# Patient Record
Sex: Female | Born: 1965 | Race: White | Hispanic: No | Marital: Married | Smoking: Current every day smoker
Health system: Southern US, Community
[De-identification: ages and names within clinical notes are randomized; demographics above are authoritative.]

## PROBLEM LIST (undated history)

## (undated) DIAGNOSIS — R002 Palpitations: Secondary | ICD-10-CM

## (undated) DIAGNOSIS — M25569 Pain in unspecified knee: Secondary | ICD-10-CM

## (undated) DIAGNOSIS — K219 Gastro-esophageal reflux disease without esophagitis: Secondary | ICD-10-CM

## (undated) DIAGNOSIS — T8859XA Other complications of anesthesia, initial encounter: Secondary | ICD-10-CM

## (undated) DIAGNOSIS — E781 Pure hyperglyceridemia: Secondary | ICD-10-CM

## (undated) DIAGNOSIS — E785 Hyperlipidemia, unspecified: Secondary | ICD-10-CM

## (undated) DIAGNOSIS — F419 Anxiety disorder, unspecified: Secondary | ICD-10-CM

## (undated) DIAGNOSIS — G43109 Migraine with aura, not intractable, without status migrainosus: Secondary | ICD-10-CM

## (undated) DIAGNOSIS — H409 Unspecified glaucoma: Secondary | ICD-10-CM

## (undated) DIAGNOSIS — R7303 Prediabetes: Secondary | ICD-10-CM

## (undated) DIAGNOSIS — E282 Polycystic ovarian syndrome: Secondary | ICD-10-CM

## (undated) DIAGNOSIS — R600 Localized edema: Secondary | ICD-10-CM

## (undated) DIAGNOSIS — M549 Dorsalgia, unspecified: Secondary | ICD-10-CM

## (undated) DIAGNOSIS — G4730 Sleep apnea, unspecified: Secondary | ICD-10-CM

## (undated) DIAGNOSIS — L409 Psoriasis, unspecified: Secondary | ICD-10-CM

## (undated) DIAGNOSIS — F32A Depression, unspecified: Secondary | ICD-10-CM

## (undated) DIAGNOSIS — K589 Irritable bowel syndrome without diarrhea: Secondary | ICD-10-CM

## (undated) DIAGNOSIS — T4145XA Adverse effect of unspecified anesthetic, initial encounter: Secondary | ICD-10-CM

## (undated) HISTORY — DX: Gastro-esophageal reflux disease without esophagitis: K21.9

## (undated) HISTORY — DX: Sleep apnea, unspecified: G47.30

## (undated) HISTORY — DX: Pain in unspecified knee: M25.569

## (undated) HISTORY — DX: Migraine with aura, not intractable, without status migrainosus: G43.109

## (undated) HISTORY — PX: CERVICAL CERCLAGE: SHX1329

## (undated) HISTORY — DX: Hyperlipidemia, unspecified: E78.5

## (undated) HISTORY — DX: Psoriasis, unspecified: L40.9

## (undated) HISTORY — DX: Depression, unspecified: F32.A

## (undated) HISTORY — DX: Palpitations: R00.2

## (undated) HISTORY — PX: CHOLECYSTECTOMY: SHX55

## (undated) HISTORY — DX: Pure hyperglyceridemia: E78.1

## (undated) HISTORY — DX: Irritable bowel syndrome, unspecified: K58.9

## (undated) HISTORY — DX: Prediabetes: R73.03

## (undated) HISTORY — DX: Polycystic ovarian syndrome: E28.2

## (undated) HISTORY — DX: Dorsalgia, unspecified: M54.9

## (undated) HISTORY — DX: Localized edema: R60.0

---

## 2005-10-25 ENCOUNTER — Ambulatory Visit: Payer: Self-pay | Admitting: Gastroenterology

## 2005-10-28 ENCOUNTER — Ambulatory Visit: Payer: Self-pay | Admitting: Internal Medicine

## 2005-10-28 ENCOUNTER — Ambulatory Visit (HOSPITAL_COMMUNITY): Admission: RE | Admit: 2005-10-28 | Discharge: 2005-10-28 | Payer: Self-pay | Admitting: Internal Medicine

## 2005-12-13 ENCOUNTER — Ambulatory Visit: Payer: Self-pay | Admitting: Internal Medicine

## 2006-02-26 ENCOUNTER — Ambulatory Visit: Payer: Self-pay | Admitting: Orthopedic Surgery

## 2006-03-31 ENCOUNTER — Ambulatory Visit: Payer: Self-pay | Admitting: Orthopedic Surgery

## 2012-03-30 ENCOUNTER — Ambulatory Visit (INDEPENDENT_AMBULATORY_CARE_PROVIDER_SITE_OTHER): Payer: BC Managed Care – PPO | Admitting: Orthopedic Surgery

## 2012-03-30 ENCOUNTER — Ambulatory Visit (INDEPENDENT_AMBULATORY_CARE_PROVIDER_SITE_OTHER): Payer: BC Managed Care – PPO

## 2012-03-30 ENCOUNTER — Encounter: Payer: Self-pay | Admitting: Orthopedic Surgery

## 2012-03-30 VITALS — BP 100/60 | Ht 64.0 in | Wt 261.0 lb

## 2012-03-30 DIAGNOSIS — M25569 Pain in unspecified knee: Secondary | ICD-10-CM

## 2012-03-30 DIAGNOSIS — M222X9 Patellofemoral disorders, unspecified knee: Secondary | ICD-10-CM | POA: Insufficient documentation

## 2012-03-30 DIAGNOSIS — M25561 Pain in right knee: Secondary | ICD-10-CM

## 2012-03-30 NOTE — Patient Instructions (Addendum)
Knee Exercises  EXERCISES  RANGE OF MOTION(ROM) AND STRETCHING EXERCISES  These exercises may help you when beginning to rehabilitate your injury. Your symptoms may resolve with or without further involvement from your physician, physical therapist or athletic trainer. While completing these exercises, remember:   · Restoring tissue flexibility helps normal motion to return to the joints. This allows healthier, less painful movement and activity.  · An effective stretch should be held for at least 30 seconds.  · A stretch should never be painful. You should only feel a gentle lengthening or release in the stretched tissue.  STRETCH - Knee Extension, Prone  · Lie on your stomach on a firm surface, such as a bed or countertop. Place your right / left knee and leg just beyond the edge of the surface. You may wish to place a towel under the far end of your right / left thigh for comfort.  · Relax your leg muscles and allow gravity to straighten your knee. Your clinician may advise you to add an ankle weight if more resistance is helpful for you.  · You should feel a stretch in the back of your right / left knee. Hold this position for __________ seconds.  Repeat __________ times. Complete this stretch __________ times per day.  * Your physician, physical therapist or athletic trainer may ask you to add ankle weight to enhance your stretch.   RANGE OF MOTION - Knee Flexion, Active  · Lie on your back with both knees straight. (If this causes back discomfort, bend your opposite knee, placing your foot flat on the floor.)  · Slowly slide your heel back toward your buttocks until you feel a gentle stretch in the front of your knee or thigh.  · Hold for __________ seconds. Slowly slide your heel back to the starting position.  Repeat __________ times. Complete this exercise __________ times per day.   STRETCH - Quadriceps, Prone   · Lie on your stomach on a firm surface, such as a bed or padded floor.  · Bend your right /  left knee and grasp your ankle. If you are unable to reach, your ankle or pant leg, use a belt around your foot to lengthen your reach.  · Gently pull your heel toward your buttocks. Your knee should not slide out to the side. You should feel a stretch in the front of your thigh and/or knee.  · Hold this position for __________ seconds.  Repeat __________ times. Complete this stretch __________ times per day.   STRETCH - Hamstrings, Supine   · Lie on your back. Loop a belt or towel over the ball of your right / left foot.  · Straighten your right / left knee and slowly pull on the belt to raise your leg. Do not allow the right / left knee to bend. Keep your opposite leg flat on the floor.  · Raise the leg until you feel a gentle stretch behind your right / left knee or thigh. Hold this position for __________ seconds.  Repeat __________ times. Complete this stretch __________ times per day.   STRENGTHENING EXERCISES  These exercises may help you when beginning to rehabilitate your injury. They may resolve your symptoms with or without further involvement from your physician, physical therapist or athletic trainer. While completing these exercises, remember:   · Muscles can gain both the endurance and the strength needed for everyday activities through controlled exercises.  · Complete these exercises as instructed by your physician,   physical therapist or athletic trainer. Progress the resistance and repetitions only as guided.  · You may experience muscle soreness or fatigue, but the pain or discomfort you are trying to eliminate should never worsen during these exercises. If this pain does worsen, stop and make certain you are following the directions exactly. If the pain is still present after adjustments, discontinue the exercise until you can discuss the trouble with your clinician.  STRENGTH - Quadriceps, Isometrics  · Lie on your back with your right / left leg extended and your opposite knee  bent.  · Gradually tense the muscles in the front of your right / left thigh. You should see either your knee cap slide up toward your hip or increased dimpling just above the knee. This motion will push the back of the knee down toward the floor/mat/bed on which you are lying.  · Hold the muscle as tight as you can without increasing your pain for __________ seconds.  · Relax the muscles slowly and completely in between each repetition.  Repeat __________ times. Complete this exercise __________ times per day.   STRENGTH - Quadriceps, Short Arcs   · Lie on your back. Place a __________ inch towel roll under your knee so that the knee slightly bends.  · Raise only your lower leg by tightening the muscles in the front of your thigh. Do not allow your thigh to rise.  · Hold this position for __________ seconds.  Repeat __________ times. Complete this exercise __________ times per day.   OPTIONAL ANKLE WEIGHTS: Begin with ____________________, but DO NOT exceed ____________________. Increase in 1 pound/0.5 kilogram increments.   STRENGTH - Quadriceps, Straight Leg Raises   Quality counts! Watch for signs that the quadriceps muscle is working to insure you are strengthening the correct muscles and not "cheating" by substituting with healthier muscles.  · Lay on your back with your right / left leg extended and your opposite knee bent.  · Tense the muscles in the front of your right / left thigh. You should see either your knee cap slide up or increased dimpling just above the knee. Your thigh may even quiver.  · Tighten these muscles even more and raise your leg 4 to 6 inches off the floor. Hold for __________ seconds.  · Keeping these muscles tense, lower your leg.  · Relax the muscles slowly and completely in between each repetition.  Repeat __________ times. Complete this exercise __________ times per day.   STRENGTH - Hamstring, Curls  · Lay on your stomach with your legs extended. (If you lay on a bed, your feet  may hang over the edge.)  · Tighten the muscles in the back of your thigh to bend your right / left knee up to 90 degrees. Keep your hips flat on the bed/floor.  · Hold this position for __________ seconds.  · Slowly lower your leg back to the starting position.  Repeat __________ times. Complete this exercise __________ times per day.   OPTIONAL ANKLE WEIGHTS: Begin with ____________________, but DO NOT exceed ____________________. Increase in 1 pound/0.5 kilogram increments.   STRENGTH - Quadriceps, Squats  · Stand in a door frame so that your feet and knees are in line with the frame.  · Use your hands for balance, not support, on the frame.  · Slowly lower your weight, bending at the hips and knees. Keep your lower legs upright so that they are parallel with the door frame. Squat only within the range   that does not increase your knee pain. Never let your hips drop below your knees.  · Slowly return upright, pushing with your legs, not pulling with your hands.  Repeat __________ times. Complete this exercise __________ times per day.   STRENGTH - Quadriceps, Wall Slides   Follow guidelines for form closely. Increased knee pain often results from poorly placed feet or knees.  · Lean against a smooth wall or door and walk your feet out 18-24 inches. Place your feet hip-width apart.  · Slowly slide down the wall or door until your knees bend __________ degrees.* Keep your knees over your heels, not your toes, and in line with your hips, not falling to either side.  · Hold for __________ seconds. Stand up to rest for __________ seconds in between each repetition.  Repeat __________ times. Complete this exercise __________ times per day.  * Your physician, physical therapist or athletic trainer will alter this angle based on your symptoms and progress.  Document Released: 04/10/2005 Document Revised: 08/19/2011 Document Reviewed: 09/08/2008  [employer]® Patient Information ©2013 [employer].

## 2012-03-31 ENCOUNTER — Encounter: Payer: Self-pay | Admitting: Orthopedic Surgery

## 2012-03-31 NOTE — Progress Notes (Signed)
Patient ID: Cheyenne Hamilton, female   DOB: June 24, 1965, 46 y.o.   MRN: [id number] Chief Complaint  Patient presents with  . Knee Pain    right knee pain, injured 08/2011      46 year old female questionable injury to the right knee in March of 2013. She participate in some classes she has pain when she twists on the knee she may have injured it back in March but then had a second event while she was weed eating in the foot and leg were as awkward position. She presents complaining of sharp throbbing pain at night in bed. She is 8/10 pain that comes and goes and is worse when she gets up from a sitting position to a standing position. She has difficulty squatting. She has not had locking or catching as no swelling. She takes medication for reflux and cannot take NSAIDs  She does have some palpitations when she's under stress she has heartburn psoriasis and anxiety all other systems reviewed were normal  History reviewed   Exam  Nursing note and vitals reviewed. Constitutional: She is oriented to person, place, and time. She appears well-developed and well-nourished.  HENT:  Head: Normocephalic.  Neck: Neck supple.  Cardiovascular: Intact distal pulses.   Pulmonary/Chest: No respiratory distress.  Abdominal: She exhibits no distension.  Neurological: She is alert and oriented to person, place, and time. She has normal reflexes. She displays normal reflexes. She exhibits normal muscle tone. Coordination normal.  Skin: Skin is warm and dry. No rash noted. No erythema. No pallor.  Psychiatric: She has a normal mood and affect. Her behavior is normal. Judgment and thought content normal.  Ortho Exam   Right knee shows no tenderness in the patellofemoral region including the quadriceps and patellar tendon she has full range of motion in the knee the knee is stable strength is normal scans intact. Provocative tests for patellofemoral disease negative. Provocative tests for meniscal pathology  McMurray's positive for soft positive, Apley's test soft positive  X-rays unremarkable   Anterior knee pain syndrome versus medial meniscal tear  Recommend he exercise program followup in one month

## 2017-02-03 ENCOUNTER — Encounter (INDEPENDENT_AMBULATORY_CARE_PROVIDER_SITE_OTHER): Payer: Self-pay | Admitting: *Deleted

## 2017-02-17 ENCOUNTER — Telehealth: Payer: Self-pay

## 2017-02-17 NOTE — Telephone Encounter (Signed)
Gastroenterology Pre-Procedure Review  Request Date: Requesting Physician: [place]  Last TCS at age of 53  PATIENT REVIEW QUESTIONS: The patient responded to the following health history questions as indicated:    1. Diabetes Melitis: NO 2. Joint replacements in the past 12 months: NO 3. Major health problems in the past 3 months: NO 4. Has an artificial valve or MVP: NO 5. Has a defibrillator: NO 6. Has been advised in past to take antibiotics in advance of a procedure like teeth cleaning: NO 7. Family history of colon cancer: NO 8. Alcohol Use: NO 9. History of sleep apnea: NO 10. History of coronary artery or other vascular stents placed within the last 12 months: NO 11. History of any prior anesthesia complications: NO    MEDICATIONS & ALLERGIES:    Patient reports the following regarding taking any blood thinners:   Plavix? NO Aspirin? NO Coumadin? NO Brilinta? NO Xarelto? NO Eliquis? NO Pradaxa? NO Savaysa? NO Effient? NO  Patient confirms/reports the following medications:  Current Outpatient Prescriptions  Medication Sig Dispense Refill  . CLONAZEPAM PO Take 0.5 mg by mouth as needed.     . doxycycline (VIBRAMYCIN) 100 MG capsule Take 100 mg by mouth as needed.    Marland Kitchen FLUoxetine HCl (PROZAC PO) Take 40 mg by mouth daily.     . folic acid (FOLVITE) 1 MG tablet Take 1 mg by mouth daily.    . furosemide (LASIX) 20 MG tablet Take 20 mg by mouth.    . methotrexate (RHEUMATREX) 2.5 MG tablet Take 7.5 mg by mouth 3 (three) times a week. 6 pills every monday    . Multiple Vitamin (MULTIVITAMIN) capsule Take 1 capsule by mouth daily.    Marland Kitchen OMEPRAZOLE PO Take 20 mg by mouth daily.      No current facility-administered medications for this visit.     Patient confirms/reports the following allergies:  Allergies  Allergen Reactions  . Hydrocodone     No orders of the defined types were placed in this encounter.   AUTHORIZATION INFORMATION Primary Insurance: [place],   [place] #:[id number] ,  Group #:[id number]  Pre-Cert / Josem Kaufmann required:  Pre-Cert / Auth #:    SCHEDULE INFORMATION: Procedure has been scheduled as follows:  Date: , Time:   Location:   This Gastroenterology Pre-Precedure Review Form is being routed to the following provider(s): R. Garfield Cornea, MD She would like a Monday (not 02/06/16)

## 2017-02-17 NOTE — Telephone Encounter (Signed)
Pt received a letter from DS to be triaged for a colonoscopy. I told her that DS was at lunch and she does her triages between 130-430pm. 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 She said that she would try to call back around 330pm on her break and speak to DS then. Patient does not take blood thinners.

## 2017-02-18 NOTE — Telephone Encounter (Signed)
Please find out how often patient takes clonazepam. If she takes several times per week, then she will need ov to consider propofol since she is also on Prozac.

## 2017-02-19 NOTE — Telephone Encounter (Signed)
LMOM to call back

## 2017-02-24 NOTE — Telephone Encounter (Signed)
LMOM to call back

## 2017-02-24 NOTE — Telephone Encounter (Signed)
Pt has an OV on Nov 8 @ 2:30 pm

## 2017-02-27 NOTE — Telephone Encounter (Signed)
Pt's appointment has been moved up to 03/03/17 @ 12:30 pm. She is aware.

## 2017-03-03 ENCOUNTER — Ambulatory Visit (INDEPENDENT_AMBULATORY_CARE_PROVIDER_SITE_OTHER): Payer: 59 | Admitting: Nurse Practitioner

## 2017-03-03 ENCOUNTER — Other Ambulatory Visit: Payer: Self-pay

## 2017-03-03 ENCOUNTER — Encounter: Payer: Self-pay | Admitting: Nurse Practitioner

## 2017-03-03 DIAGNOSIS — R197 Diarrhea, unspecified: Secondary | ICD-10-CM | POA: Diagnosis not present

## 2017-03-03 DIAGNOSIS — Z1211 Encounter for screening for malignant neoplasm of colon: Secondary | ICD-10-CM

## 2017-03-03 DIAGNOSIS — R69 Illness, unspecified: Secondary | ICD-10-CM

## 2017-03-03 MED ORDER — NA SULFATE-K SULFATE-MG SULF 17.5-3.13-1.6 GM/177ML PO SOLN
1.0000 | ORAL | 0 refills | Status: DC
Start: 2017-03-03 — End: 2017-03-11

## 2017-03-03 MED ORDER — CHOLESTYRAMINE 4 G PO PACK: 4.0000 g | PACK | Freq: Three times a day (TID) | ORAL | 12 refills | Status: DC

## 2017-03-03 NOTE — Patient Instructions (Addendum)
1. We will request your previous colonoscopy reports from the hospital to have been scanned in an readily available in our system. 2. We will schedule your procedure for you. 3. I have sent in Questran 4g powder. Take this once a day. 4. Call us in a couple weeks and let us know if it is helping. 5. Return for follow-up in 3 months.

## 2017-03-03 NOTE — Assessment & Plan Note (Signed)
Patient with significant diarrhea which she stated was due to irritable bowel syndrome diarrhea type. However, given the fact she does not have any abdominal pain and her diarrhea all started after her cholecystectomy she is more likely having bile salt diarrhea. I will trial her on Colestid pills 2 g twice a day. Return for follow-up in 3 months. Further evaluation by colonoscopy as per above.

## 2017-03-03 NOTE — Assessment & Plan Note (Signed)
The patient is due for a colonoscopy. She is on multiple medications which can cause high risk/difficult sedation. We will plan for the procedure on propofol in order to help mitigate these risks. Proceed with colonoscopy as previously recommended.  Proceed with TCS on propofol/MAC with Dr. Gala Romney in near future: the risks, benefits, and alternatives have been discussed with the patient in detail. The patient states understanding and desires to proceed.  The patient is currently on clonazepam and Klonopin. She is also very high anxiety. No other anticoagulants, anxiolytics, chronic pain medications, or antidepressants. We will plan for propofol/MAC to promote adequate sedation.

## 2017-03-03 NOTE — Progress Notes (Signed)
Primary Care Physician:  Denny Levy, [place] Primary Gastroenterologist:  Dr. Gala Romney  Chief Complaint  Patient presents with  . Colonoscopy    HPI:   Cheyenne Hamilton is a 51 y.o. female who presents To discuss upcoming colonoscopy given polypharmacy. Phone triage was attempted but was diverted to an office visit because of Klonopin and clonazepam likely necessitating propofol and the need for office visit and preoperative appointment prior to propofol with anesthesia service. No previous visits in our office.  Today she states she had a colonoscopy with our group about 11 years ago, report not in system. She is now due to colonoscopy. Denies persistent abdominal pain, N/V, hematochezia, melena, fever, chills, unintentional weight loss, acute bowel habit changes. Does have "pretty severe IBS-D" but doesn't take anything because she's scared it'll overshoot and lead to constipation. States her IBS-D started when she had her gallbladder out. States she was Rx Bentyl but didn't take it because she read somewhere that it can cause heatstroke. Has 6-8 loose stools a day "and it really impacts my life." Denies chest pain, dyspnea, dizziness, lightheadedness, syncope, near syncope. Denies any other upper or lower GI symptoms.  Past Medical History:  Diagnosis Date  . Acid reflux     Past Surgical History:  Procedure Laterality Date  . CERVICAL CERCLAGE    . CHOLECYSTECTOMY      Current Outpatient Prescriptions  Medication Sig Dispense Refill  . CLONAZEPAM PO Take 0.5 mg by mouth as needed.     . doxycycline (VIBRAMYCIN) 100 MG capsule Take 100 mg by mouth as needed.    Marland Kitchen FLUoxetine HCl (PROZAC PO) Take 40 mg by mouth daily.     . folic acid (FOLVITE) 1 MG tablet Take 1 mg by mouth daily.    . furosemide (LASIX) 20 MG tablet Take 20 mg by mouth.    . methotrexate (RHEUMATREX) 2.5 MG tablet Take 7.5 mg by mouth 3 (three) times a week. 6 pills every monday    . Multiple Vitamin  (MULTIVITAMIN) capsule Take 1 capsule by mouth daily.    Marland Kitchen OMEPRAZOLE PO Take 20 mg by mouth daily.     . cholestyramine (QUESTRAN) 4 g packet Take 1 packet (4 g total) by mouth 3 (three) times daily with meals. 60 each 12   No current facility-administered medications for this visit.     Allergies as of 03/03/2017 - Review Complete 03/03/2017  Allergen Reaction Noted  . Hydrocodone  03/30/2012    Family History  Problem Relation Age of Onset  . Arthritis Unknown     Social History   Social History  . Marital status: Single    Spouse name: N/A  . Number of children: N/A  . Years of education: 92   Occupational History  . Not on file.   Social History Main Topics  . Smoking status: Current Every Day Smoker    Packs/day: 0.50  . Smokeless tobacco: Never Used  . Alcohol use No  . Drug use: No  . Sexual activity: Not on file   Other Topics Concern  . Not on file   Social History Narrative  . No narrative on file    Review of Systems: General: Negative for anorexia, weight loss, fever, chills, fatigue, weakness. ENT: Negative for hoarseness, difficulty swallowing , nasal congestion. CV: Negative for chest pain, angina, palpitations, dyspnea on exertion, peripheral edema.  Respiratory: Negative for dyspnea at rest, dyspnea on exertion, cough, sputum, wheezing.  GI: See history  of present illness. MS: Negative for joint pain, low back pain.  Derm: Negative for rash or itching.  Psych: Admits depression.  Endo: Negative for unusual weight change.  Heme: Negative for bruising or bleeding. Allergy: Negative for rash or hives.    Physical Exam: BP 116/76   Pulse 71   Temp (!) 97.2 F (36.2 C) (Oral)   Ht 5\' 4"  (1.626 m)   Wt 245 lb 12.8 oz (111.5 kg)   BMI 42.19 kg/m  General:   Obese female. Alert and oriented. Pleasant and cooperative. Well-nourished and well-developed.  Head:  Normocephalic and atraumatic. Eyes:  Without icterus, sclera clear and  conjunctiva pink.  Ears:  Normal auditory acuity. Cardiovascular:  S1, S2 present without murmurs appreciated. Extremities without clubbing or edema. Respiratory:  Clear to auscultation bilaterally. No wheezes, rales, or rhonchi. No distress.  Gastrointestinal:  +BS, obese but soft, non-tender and non-distended. No HSM noted. No guarding or rebound. No masses appreciated.  Rectal:  Deferred  Musculoskalatal:  Symmetrical without gross deformities. Normal posture. Neurologic:  Alert and oriented x4;  grossly normal neurologically. Psych:  Alert and cooperative. Normal mood and affect. Heme/Lymph/Immune: No excessive bruising noted.    03/03/2017 1:36 PM   Disclaimer: This note was dictated with voice recognition software. Similar sounding words can inadvertently be transcribed and may not be corrected upon review.

## 2017-03-03 NOTE — Progress Notes (Signed)
CC'D TO PCP °

## 2017-03-04 NOTE — Patient Instructions (Signed)
PA info for Colonoscopy submitted via [facility] website. Notification/prior authorization reference number: [id number].

## 2017-03-04 NOTE — Patient Instructions (Signed)
[employer] called and spoke to GF. New notification/prior authorization reference number is [id number].

## 2017-03-12 ENCOUNTER — Encounter (HOSPITAL_COMMUNITY): Payer: Self-pay

## 2017-03-12 NOTE — Patient Instructions (Signed)
Cheyenne Hamilton  03/12/2017     @PREFPERIOPPHARMACY @   Your procedure is scheduled on  03/21/2017 .  Report to [facility] at  615  A.M.  Call this number if you have problems the morning of surgery:  [phone]   Remember:  Do not eat food or drink liquids after midnight.  Take these medicines the morning of surgery with A SIP OF WATER  Prozac, omeprazole.   Do not wear jewelry, make-up or nail polish.  Do not wear lotions, powders, or perfumes, or deoderant.  Do not shave 48 hours prior to surgery.  Men may shave face and neck.  Do not bring valuables to the hospital.  [facility] is not responsible for any belongings or valuables.  Contacts, dentures or bridgework may not be worn into surgery.  Leave your suitcase in the car.  After surgery it may be brought to your room.  For patients admitted to the hospital, discharge time will be determined by your treatment team.  Patients discharged the day of surgery will not be allowed to drive home.   Name and phone number of your driver:   family Special instructions:  Follow the diet and prep instructions given to you by Dr Roseanne Kaufman office.  Please read over the following fact sheets that you were given. Anesthesia Post-op Instructions and Care and Recovery After Surgery       Colonoscopy, Adult A colonoscopy is an exam to look at the entire large intestine. During the exam, a lubricated, bendable tube is inserted into the anus and then passed into the rectum, colon, and other parts of the large intestine. A colonoscopy is often done as a part of normal colorectal screening or in response to certain symptoms, such as anemia, persistent diarrhea, abdominal pain, and blood in the stool. The exam can help screen for and diagnose medical problems, including:  Tumors.  Polyps.  Inflammation.  Areas of bleeding.  Tell a health care provider about:  Any allergies you have.  All medicines you are taking,  including vitamins, herbs, eye drops, creams, and over-the-counter medicines.  Any problems you or family members have had with anesthetic medicines.  Any blood disorders you have.  Any surgeries you have had.  Any medical conditions you have.  Any problems you have had passing stool. What are the risks? Generally, this is a safe procedure. However, problems may occur, including:  Bleeding.  A tear in the intestine.  A reaction to medicines given during the exam.  Infection (rare).  What happens before the procedure? Eating and drinking restrictions Follow instructions from your health care provider about eating and drinking, which may include:  A few days before the procedure - follow a low-fiber diet. Avoid nuts, seeds, dried fruit, raw fruits, and vegetables.  1-3 days before the procedure - follow a clear liquid diet. Drink only clear liquids, such as clear broth or bouillon, black coffee or tea, clear juice, clear soft drinks or sports drinks, gelatin dessert, and popsicles. Avoid any liquids that contain red or purple dye.  On the day of the procedure - do not eat or drink anything during the 2 hours before the procedure, or within the time period that your health care provider recommends.  Bowel prep If you were prescribed an oral bowel prep to clean out your colon:  Take it as told by your health care provider. Starting the day before your procedure, you  will need to drink a large amount of medicated liquid. The liquid will cause you to have multiple loose stools until your stool is almost clear or light green.  If your skin or anus gets irritated from diarrhea, you may use these to relieve the irritation: ? Medicated wipes, such as adult wet wipes with aloe and vitamin E. ? A skin soothing-product like petroleum jelly.  If you vomit while drinking the bowel prep, take a break for up to 60 minutes and then begin the bowel prep again. If vomiting continues and you  cannot take the bowel prep without vomiting, call your health care provider.  General instructions  Ask your health care provider about changing or stopping your regular medicines. This is especially important if you are taking diabetes medicines or blood thinners.  Plan to have someone take you home from the hospital or clinic. What happens during the procedure?  An IV tube may be inserted into one of your veins.  You will be given medicine to help you relax (sedative).  To reduce your risk of infection: ? Your health care team will wash or sanitize their hands. ? Your anal area will be washed with soap.  You will be asked to lie on your side with your knees bent.  Your health care provider will lubricate a long, thin, flexible tube. The tube will have a camera and a light on the end.  The tube will be inserted into your anus.  The tube will be gently eased through your rectum and colon.  Air will be delivered into your colon to keep it open. You may feel some pressure or cramping.  The camera will be used to take images during the procedure.  A small tissue sample may be removed from your body to be examined under a microscope (biopsy). If any potential problems are found, the tissue will be sent to a lab for testing.  If small polyps are found, your health care provider may remove them and have them checked for cancer cells.  The tube that was inserted into your anus will be slowly removed. The procedure may vary among health care providers and hospitals. What happens after the procedure?  Your blood pressure, heart rate, breathing rate, and blood oxygen level will be monitored until the medicines you were given have worn off.  Do not drive for 24 hours after the exam.  You may have a small amount of blood in your stool.  You may pass gas and have mild abdominal cramping or bloating due to the air that was used to inflate your colon during the exam.  It is up to you to  get the results of your procedure. Ask your health care provider, or the department performing the procedure, when your results will be ready. This information is not intended to replace advice given to you by your health care provider. Make sure you discuss any questions you have with your health care provider. Document Released: 05/24/2000 Document Revised: 03/27/2016 Document Reviewed: 08/08/2015 Elsevier Interactive Patient Education  2018 [employer].  Colonoscopy, Adult, Care After This sheet gives you information about how to care for yourself after your procedure. Your health care provider may also give you more specific instructions. If you have problems or questions, contact your health care provider. What can I expect after the procedure? After the procedure, it is common to have:  A small amount of blood in your stool for 24 hours after the procedure.  Some  gas.  Mild abdominal cramping or bloating.  Follow these instructions at home: General instructions   For the first 24 hours after the procedure: ? Do not drive or use machinery. ? Do not sign important documents. ? Do not drink alcohol. ? Do your regular daily activities at a slower pace than normal. ? Eat soft, easy-to-digest foods. ? Rest often.  Take over-the-counter or prescription medicines only as told by your health care provider.  It is up to you to get the results of your procedure. Ask your health care provider, or the department performing the procedure, when your results will be ready. Relieving cramping and bloating  Try walking around when you have cramps or feel bloated.  Apply heat to your abdomen as told by your health care provider. Use a heat source that your health care provider recommends, such as a moist heat pack or a heating pad. ? Place a towel between your skin and the heat source. ? Leave the heat on for 20-30 minutes. ? Remove the heat if your skin turns bright red. This is  especially important if you are unable to feel pain, heat, or cold. You may have a greater risk of getting burned. Eating and drinking  Drink enough fluid to keep your urine clear or pale yellow.  Resume your normal diet as instructed by your health care provider. Avoid heavy or fried foods that are hard to digest.  Avoid drinking alcohol for as long as instructed by your health care provider. Contact a health care provider if:  You have blood in your stool 2-3 days after the procedure. Get help right away if:  You have more than a small spotting of blood in your stool.  You pass large blood clots in your stool.  Your abdomen is swollen.  You have nausea or vomiting.  You have a fever.  You have increasing abdominal pain that is not relieved with medicine. This information is not intended to replace advice given to you by your health care provider. Make sure you discuss any questions you have with your health care provider. Document Released: 01/09/2004 Document Revised: 02/19/2016 Document Reviewed: 08/08/2015 Elsevier Interactive Patient Education  2018 [place] Anesthesia is a term that refers to techniques, procedures, and medicines that help a person stay safe and comfortable during a medical procedure. Monitored anesthesia care, or sedation, is one type of anesthesia. Your anesthesia specialist may recommend sedation if you will be having a procedure that does not require you to be unconscious, such as:  Cataract surgery.  A dental procedure.  A biopsy.  A colonoscopy.  During the procedure, you may receive a medicine to help you relax (sedative). There are three levels of sedation:  Mild sedation. At this level, you may feel awake and relaxed. You will be able to follow directions.  Moderate sedation. At this level, you will be sleepy. You may not remember the procedure.  Deep sedation. At this level, you will be asleep. You will  not remember the procedure.  The more medicine you are given, the deeper your level of sedation will be. Depending on how you respond to the procedure, the anesthesia specialist may change your level of sedation or the type of anesthesia to fit your needs. An anesthesia specialist will monitor you closely during the procedure. Let your health care provider know about:  Any allergies you have.  All medicines you are taking, including vitamins, herbs, eye drops, creams, and over-the-counter  medicines.  Any use of steroids (by mouth or as a cream).  Any problems you or family members have had with sedatives and anesthetic medicines.  Any blood disorders you have.  Any surgeries you have had.  Any medical conditions you have, such as sleep apnea.  Whether you are pregnant or may be pregnant.  Any use of cigarettes, alcohol, or street drugs. What are the risks? Generally, this is a safe procedure. However, problems may occur, including:  Getting too much medicine (oversedation).  Nausea.  Allergic reaction to medicines.  Trouble breathing. If this happens, a breathing tube may be used to help with breathing. It will be removed when you are awake and breathing on your own.  Heart trouble.  Lung trouble.  Before the procedure Staying hydrated Follow instructions from your health care provider about hydration, which may include:  Up to 2 hours before the procedure - you may continue to drink clear liquids, such as water, clear fruit juice, black coffee, and plain tea.  Eating and drinking restrictions Follow instructions from your health care provider about eating and drinking, which may include:  8 hours before the procedure - stop eating heavy meals or foods such as meat, fried foods, or fatty foods.  6 hours before the procedure - stop eating light meals or foods, such as toast or cereal.  6 hours before the procedure - stop drinking milk or drinks that contain milk.  2  hours before the procedure - stop drinking clear liquids.  Medicines Ask your health care provider about:  Changing or stopping your regular medicines. This is especially important if you are taking diabetes medicines or blood thinners.  Taking medicines such as aspirin and ibuprofen. These medicines can thin your blood. Do not take these medicines before your procedure if your health care provider instructs you not to.  Tests and exams  You will have a physical exam.  You may have blood tests done to show: ? How well your kidneys and liver are working. ? How well your blood can clot.  General instructions  Plan to have someone take you home from the hospital or clinic.  If you will be going home right after the procedure, plan to have someone with you for 24 hours.  What happens during the procedure?  Your blood pressure, heart rate, breathing, level of pain and overall condition will be monitored.  An IV tube will be inserted into one of your veins.  Your anesthesia specialist will give you medicines as needed to keep you comfortable during the procedure. This may mean changing the level of sedation.  The procedure will be performed. After the procedure  Your blood pressure, heart rate, breathing rate, and blood oxygen level will be monitored until the medicines you were given have worn off.  Do not drive for 24 hours if you received a sedative.  You may: ? Feel sleepy, clumsy, or nauseous. ? Feel forgetful about what happened after the procedure. ? Have a sore throat if you had a breathing tube during the procedure. ? Vomit. This information is not intended to replace advice given to you by your health care provider. Make sure you discuss any questions you have with your health care provider. Document Released: 02/20/2005 Document Revised: 11/03/2015 Document Reviewed: 09/17/2015 Elsevier Interactive Patient Education  2018 [place],  Care After These instructions provide you with information about caring for yourself after your procedure. Your health care provider may also give  you more specific instructions. Your treatment has been planned according to current medical practices, but problems sometimes occur. Call your health care provider if you have any problems or questions after your procedure. What can I expect after the procedure? After your procedure, it is common to:  Feel sleepy for several hours.  Feel clumsy and have poor balance for several hours.  Feel forgetful about what happened after the procedure.  Have poor judgment for several hours.  Feel nauseous or vomit.  Have a sore throat if you had a breathing tube during the procedure.  Follow these instructions at home: For at least 24 hours after the procedure:   Do not: ? Participate in activities in which you could fall or become injured. ? Drive. ? Use heavy machinery. ? Drink alcohol. ? Take sleeping pills or medicines that cause drowsiness. ? Make important decisions or sign legal documents. ? Take care of children on your own.  Rest. Eating and drinking  Follow the diet that is recommended by your health care provider.  If you vomit, drink water, juice, or soup when you can drink without vomiting.  Make sure you have little or no nausea before eating solid foods. General instructions  Have a responsible adult stay with you until you are awake and alert.  Take over-the-counter and prescription medicines only as told by your health care provider.  If you smoke, do not smoke without supervision.  Keep all follow-up visits as told by your health care provider. This is important. Contact a health care provider if:  You keep feeling nauseous or you keep vomiting.  You feel light-headed.  You develop a rash.  You have a fever. Get help right away if:  You have trouble breathing. This information is not intended to replace  advice given to you by your health care provider. Make sure you discuss any questions you have with your health care provider. Document Released: 09/17/2015 Document Revised: 01/17/2016 Document Reviewed: 09/17/2015 Elsevier Interactive Patient Education  [employer].

## 2017-03-14 ENCOUNTER — Encounter (HOSPITAL_COMMUNITY)
Admission: RE | Admit: 2017-03-14 | Discharge: 2017-03-14 | Disposition: A | Discharge status: Home / Self Care | Payer: 59 | Source: Ambulatory Visit | Attending: Internal Medicine | Admitting: Internal Medicine

## 2017-03-14 ENCOUNTER — Encounter (HOSPITAL_COMMUNITY): Payer: Self-pay

## 2017-03-14 DIAGNOSIS — Z1211 Encounter for screening for malignant neoplasm of colon: Secondary | ICD-10-CM | POA: Insufficient documentation

## 2017-03-14 HISTORY — DX: Anxiety disorder, unspecified: F41.9

## 2017-03-14 LAB — BASIC METABOLIC PANEL
Anion gap: 8 (ref 5–15)
BUN: 15 mg/dL (ref 6–20)
CO2: 29 mmol/L (ref 22–32)
Calcium: 9.3 mg/dL (ref 8.9–10.3)
Chloride: 102 mmol/L (ref 101–111)
Creatinine, Ser: 0.85 mg/dL (ref 0.44–1.00)
GFR calc Af Amer: >60 mL/min (ref >60)
GFR calc non Af Amer: >60 mL/min (ref >60)
Glucose, Bld: 93 mg/dL (ref 65–99)
Potassium: 3.9 mmol/L (ref 3.5–5.1)
Sodium: 139 mmol/L (ref 135–145)

## 2017-03-14 LAB — CBC WITH DIFFERENTIAL/PLATELET
Basophils Absolute: 0 10*3/uL (ref 0.0–0.1)
Basophils Relative: 0 %
Eosinophils Absolute: 0.3 10*3/uL (ref 0.0–0.7)
Eosinophils Relative: 3 %
HCT: 42.7 % (ref 36.0–46.0)
Hemoglobin: 14.2 g/dL (ref 12.0–15.0)
Lymphocytes Relative: 28 %
Lymphs Abs: 2.8 10*3/uL (ref 0.7–4.0)
MCH: 31.5 pg (ref 26.0–34.0)
MCHC: 33.3 g/dL (ref 30.0–36.0)
MCV: 94.7 fL (ref 78.0–100.0)
Monocytes Absolute: 0.7 10*3/uL (ref 0.1–1.0)
Monocytes Relative: 7 %
Neutro Abs: 6.1 10*3/uL (ref 1.7–7.7)
Neutrophils Relative %: 62 %
Platelets: 321 10*3/uL (ref 150–400)
RBC: 4.51 MIL/uL (ref 3.87–5.11)
RDW: 13.8 % (ref 11.5–15.5)
WBC: 9.9 10*3/uL (ref 4.0–10.5)

## 2017-03-17 ENCOUNTER — Other Ambulatory Visit (HOSPITAL_COMMUNITY): Payer: 59

## 2017-03-20 ENCOUNTER — Telehealth: Payer: Self-pay | Admitting: Internal Medicine

## 2017-03-20 NOTE — Telephone Encounter (Signed)
Due to [facility] Procedure for 03-21-17 has been cancelled. I called and spoke to [name]. We will reach back out to her to reschedule.

## 2017-03-24 ENCOUNTER — Other Ambulatory Visit: Payer: Self-pay

## 2017-03-24 MED ORDER — NA SULFATE-K SULFATE-MG SULF 17.5-3.13-1.6 GM/177ML PO SOLN: 1.0000 | ORAL | 0 refills | Status: DC

## 2017-03-24 NOTE — Telephone Encounter (Signed)
Called pt. TCS w/propofol with RMR rescheduled to 04/18/17 at 8:30am. Rx for prep sent to pharmacy (she had already mixed one bottle). New instructions mailed. LMOVM and informed Endo scheduler.

## 2017-03-24 NOTE — Telephone Encounter (Signed)
Routing to clinical pool to reschedule procedure

## 2017-04-11 ENCOUNTER — Encounter (HOSPITAL_COMMUNITY): Payer: Self-pay

## 2017-04-14 ENCOUNTER — Encounter (HOSPITAL_COMMUNITY): Payer: Self-pay

## 2017-04-14 ENCOUNTER — Encounter (HOSPITAL_COMMUNITY)
Admission: RE | Admit: 2017-04-14 | Discharge: 2017-04-14 | Disposition: A | Discharge status: Home / Self Care | Payer: 59 | Source: Ambulatory Visit | Attending: Internal Medicine | Admitting: Internal Medicine

## 2017-04-17 ENCOUNTER — Ambulatory Visit: Payer: 59 | Admitting: Gastroenterology

## 2017-04-18 ENCOUNTER — Encounter (HOSPITAL_COMMUNITY): Payer: Self-pay | Admitting: *Deleted

## 2017-04-18 ENCOUNTER — Ambulatory Visit (HOSPITAL_COMMUNITY)
Admission: RE | Admit: 2017-04-18 | Discharge: 2017-04-18 | Disposition: A | Discharge status: Home / Self Care | Payer: 59 | Source: Ambulatory Visit | Attending: Internal Medicine | Admitting: Internal Medicine

## 2017-04-18 ENCOUNTER — Ambulatory Visit (HOSPITAL_COMMUNITY): Payer: 59 | Admitting: Anesthesiology

## 2017-04-18 ENCOUNTER — Ambulatory Visit: Payer: 59 | Admitting: Gastroenterology

## 2017-04-18 ENCOUNTER — Encounter (HOSPITAL_COMMUNITY)
Admission: RE | Disposition: A | Discharge status: Home / Self Care | Payer: Self-pay | Source: Ambulatory Visit | Attending: Internal Medicine

## 2017-04-18 DIAGNOSIS — K5730 Diverticulosis of large intestine without perforation or abscess without bleeding: Secondary | ICD-10-CM

## 2017-04-18 DIAGNOSIS — Z1211 Encounter for screening for malignant neoplasm of colon: Secondary | ICD-10-CM

## 2017-04-18 DIAGNOSIS — F419 Anxiety disorder, unspecified: Secondary | ICD-10-CM | POA: Diagnosis not present

## 2017-04-18 DIAGNOSIS — K529 Noninfective gastroenteritis and colitis, unspecified: Secondary | ICD-10-CM | POA: Insufficient documentation

## 2017-04-18 DIAGNOSIS — K219 Gastro-esophageal reflux disease without esophagitis: Secondary | ICD-10-CM | POA: Insufficient documentation

## 2017-04-18 DIAGNOSIS — Z7951 Long term (current) use of inhaled steroids: Secondary | ICD-10-CM | POA: Diagnosis not present

## 2017-04-18 DIAGNOSIS — F17210 Nicotine dependence, cigarettes, uncomplicated: Secondary | ICD-10-CM | POA: Diagnosis not present

## 2017-04-18 DIAGNOSIS — Z79899 Other long term (current) drug therapy: Secondary | ICD-10-CM | POA: Insufficient documentation

## 2017-04-18 DIAGNOSIS — D123 Benign neoplasm of transverse colon: Secondary | ICD-10-CM | POA: Diagnosis not present

## 2017-04-18 HISTORY — PX: BIOPSY: SHX5522

## 2017-04-18 HISTORY — PX: COLONOSCOPY WITH PROPOFOL: SHX5780

## 2017-04-18 HISTORY — DX: Adverse effect of unspecified anesthetic, initial encounter: T41.45XA

## 2017-04-18 HISTORY — PX: POLYPECTOMY: SHX5525

## 2017-04-18 HISTORY — DX: Other complications of anesthesia, initial encounter: T88.59XA

## 2017-04-18 SURGERY — COLONOSCOPY WITH PROPOFOL
Anesthesia: Monitor Anesthesia Care

## 2017-04-18 MED ORDER — PROPOFOL 500 MG/50ML IV EMUL
INTRAVENOUS | Status: DC | PRN
  Administered 2017-04-18: 100 ug/kg/min via INTRAVENOUS
  Administered 2017-04-18: 09:00:00 via INTRAVENOUS

## 2017-04-18 MED ORDER — FENTANYL CITRATE (PF) 100 MCG/2ML IJ SOLN
INTRAMUSCULAR | Status: AC
  Filled 2017-04-18: qty 2

## 2017-04-18 MED ORDER — MIDAZOLAM HCL 2 MG/2ML IJ SOLN
1.0000 mg | INTRAMUSCULAR | Status: AC
  Administered 2017-04-18: 2 mg via INTRAVENOUS
  Filled 2017-04-18: qty 2

## 2017-04-18 MED ORDER — MIDAZOLAM HCL 2 MG/2ML IJ SOLN
INTRAMUSCULAR | Status: AC
  Filled 2017-04-18: qty 2

## 2017-04-18 MED ORDER — MIDAZOLAM HCL 5 MG/5ML IJ SOLN
INTRAMUSCULAR | Status: DC | PRN
  Administered 2017-04-18: 2 mg via INTRAVENOUS

## 2017-04-18 MED ORDER — LACTATED RINGERS IV SOLN
INTRAVENOUS | Status: DC
  Administered 2017-04-18: 08:00:00 via INTRAVENOUS

## 2017-04-18 MED ORDER — FENTANYL CITRATE (PF) 100 MCG/2ML IJ SOLN
25.0000 ug | Freq: Once | INTRAMUSCULAR | Status: AC
  Administered 2017-04-18: 25 ug via INTRAVENOUS

## 2017-04-18 MED ORDER — PROPOFOL 10 MG/ML IV BOLUS
INTRAVENOUS | Status: AC
  Filled 2017-04-18: qty 20

## 2017-04-18 NOTE — Anesthesia Preprocedure Evaluation (Signed)
Anesthesia Evaluation  Patient identified by MRN, date of birth, ID band Patient awake    Reviewed: Allergy & Precautions, NPO status , Patient's Chart, lab work & pertinent test results  History of Anesthesia Complications (+) history of anesthetic complications ( "slow wake up")  Airway Mallampati: III  TM Distance: >3 FB Neck ROM: Full    Dental  (+) Teeth Intact   Pulmonary Current Smoker,    breath sounds clear to auscultation       Cardiovascular negative cardio ROS   Rhythm:Regular Rate:Normal     Neuro/Psych PSYCHIATRIC DISORDERS Anxiety    GI/Hepatic Neg liver ROS, GERD  Medicated and Controlled,  Endo/Other  Morbid obesity  Renal/GU negative Renal ROS     Musculoskeletal   Abdominal   Peds  Hematology negative hematology ROS (+)   Anesthesia Other Findings   Reproductive/Obstetrics                             Anesthesia Physical Anesthesia Plan  ASA: II  Anesthesia Plan: MAC   Post-op Pain Management:    Induction: Intravenous  PONV Risk Score and Plan:   Airway Management Planned: Simple Face Mask  Additional Equipment:   Intra-op Plan:   Post-operative Plan:   Informed Consent: I have reviewed the patients History and Physical, chart, labs and discussed the procedure including the risks, benefits and alternatives for the proposed anesthesia with the patient or authorized representative who has indicated his/her understanding and acceptance.     Plan Discussed with:   Anesthesia Plan Comments:         Anesthesia Quick Evaluation

## 2017-04-18 NOTE — Discharge Instructions (Signed)
°  Colonoscopy Discharge Instructions  Read the instructions outlined below and refer to this sheet in the next few weeks. These discharge instructions provide you with general information on caring for yourself after you leave the hospital. Your doctor may also give you specific instructions. While your treatment has been planned according to the most current medical practices available, unavoidable complications occasionally occur. If you have any problems or questions after discharge, call Dr. Gala Romney at [phone]. ACTIVITY  You may resume your regular activity, but move at a slower pace for the next 24 hours.   Take frequent rest periods for the next 24 hours.   Walking will help get rid of the air and reduce the bloated feeling in your belly (abdomen).   No driving for 24 hours (because of the medicine (anesthesia) used during the test).    Do not sign any important legal documents or operate any machinery for 24 hours (because of the anesthesia used during the test).  NUTRITION  Drink plenty of fluids.   You may resume your normal diet as instructed by your doctor.   Begin with a light meal and progress to your normal diet. Heavy or fried foods are harder to digest and may make you feel sick to your stomach (nauseated).   Avoid alcoholic beverages for 24 hours or as instructed.  MEDICATIONS  You may resume your normal medications unless your doctor tells you otherwise.  WHAT YOU CAN EXPECT TODAY  Some feelings of bloating in the abdomen.   Passage of more gas than usual.   Spotting of blood in your stool or on the toilet paper.  IF YOU HAD POLYPS REMOVED DURING THE COLONOSCOPY:  No aspirin products for 7 days or as instructed.   No alcohol for 7 days or as instructed.   Eat a soft diet for the next 24 hours.  FINDING OUT THE RESULTS OF YOUR TEST Not all test results are available during your visit. If your test results are not back during the visit, make an appointment  with your caregiver to find out the results. Do not assume everything is normal if you have not heard from your caregiver or the medical facility. It is important for you to follow up on all of your test results.  SEEK IMMEDIATE MEDICAL ATTENTION IF:  You have more than a spotting of blood in your stool.   Your belly is swollen (abdominal distention).   You are nauseated or vomiting.   You have a temperature over 101.   You have abdominal pain or discomfort that is severe or gets worse throughout the day.    Colon Diverticulosis and polyp information provided  Further recommendations to follow pending review of pathology report  Do not start cholestyramine quite yet

## 2017-04-18 NOTE — Anesthesia Postprocedure Evaluation (Signed)
Anesthesia Post Note  Patient: Cheyenne Hamilton  Procedure(s) Performed: COLONOSCOPY WITH PROPOFOL (N/A ) POLYPECTOMY BIOPSY  Patient location during evaluation: PACU Anesthesia Type: MAC Level of consciousness: awake and alert and oriented Pain management: pain level controlled Vital Signs Assessment: post-procedure vital signs reviewed and stable Respiratory status: spontaneous breathing, nonlabored ventilation and respiratory function stable Cardiovascular status: blood pressure returned to baseline Postop Assessment: no apparent nausea or vomiting Anesthetic complications: no     Last Vitals:  Vitals:   04/18/17 0713 04/18/17 0923  BP: 117/69 112/66  Pulse: 85 88  Resp: 20 15  Temp: 36.8 C 36.4 C  SpO2: 96% 94%    Last Pain:  Vitals:   04/18/17 0923  TempSrc:   PainSc: 0-No pain                 Dennys Traughber J

## 2017-04-18 NOTE — Transfer of Care (Signed)
Immediate Anesthesia Transfer of Care Note  Patient: Cheyenne Hamilton  Procedure(s) Performed: COLONOSCOPY WITH PROPOFOL (N/A ) POLYPECTOMY BIOPSY  Patient Location: PACU  Anesthesia Type:MAC  Level of Consciousness: awake, oriented and patient cooperative  Airway & Oxygen Therapy: Patient Spontanous Breathing  Post-op Assessment: Report given to RN, Post -op Vital signs reviewed and stable and Patient moving all extremities  Post vital signs: Reviewed and stable  Last Vitals:  Vitals:   04/18/17 0713  BP: 117/69  Pulse: 85  Resp: 20  Temp: 36.8 C  SpO2: 96%    Last Pain:  Vitals:   04/18/17 0853  TempSrc:   PainSc: 0-No pain      Patients Stated Pain Goal: 5 (19/41/74 0814)  Complications: No apparent anesthesia complications

## 2017-04-18 NOTE — Op Note (Signed)
[facility] Patient Name: Cheyenne Hamilton Procedure Date: 04/18/2017 7:51 AM MRN: [id number] Date of Birth: 07/24/1965 Attending MD: Norvel Richards , MD CSN: [id number] Age: 51 Admit Type: Outpatient Procedure:                Colonoscopy Indications:              Screening for colorectal malignant neoplasm;                            chronic diarrhea Providers:                Norvel Richards, MD, Lurline Del, RN, Aram Candela Referring MD:              Medicines:                Propofol per Anesthesia Complications:            No immediate complications. Estimated blood loss:                            Minimal. Estimated Blood Loss:     Estimated blood loss was minimal. Procedure:                Pre-Anesthesia Assessment:                           - Prior to the procedure, a History and Physical                            was performed, and patient medications and                            allergies were reviewed. The patient's tolerance of                            previous anesthesia was also reviewed. The risks                            and benefits of the procedure and the sedation                            options and risks were discussed with the patient.                            All questions were answered, and informed consent                            was obtained. Prior Anticoagulants: The patient has                            taken no previous anticoagulant or antiplatelet                            agents. ASA Grade Assessment: II - A  patient with                            mild systemic disease. After reviewing the risks                            and benefits, the patient was deemed in                            satisfactory condition to undergo the procedure.                           After obtaining informed consent, the colonoscope                            was passed under direct vision. Throughout the               procedure, the patient's blood pressure, pulse, and                            oxygen saturations were monitored continuously. The                            EC-[id number] ([id number]) scope was introduced through                            the anus and advanced to the the cecum, identified                            by appendiceal orifice and ileocecal valve. The                            colonoscopy was performed without difficulty. The                            patient tolerated the procedure well. The entire                            colon was well visualized. The terminal ileum,                            ileocecal valve, appendiceal orifice, and rectum                            and the ileocecal valve, appendiceal orifice, and                            rectum were photographed. The entire colon was well                            visualized. The quality of the bowel preparation                            was adequate. Scope In: 8:54:55 AM Scope Out: 9:10:00 AM  Scope Withdrawal Time: 0 hours 7 minutes 19 seconds  Total Procedure Duration: 0 hours 15 minutes 5 seconds  Findings:      The perianal and digital rectal examinations were normal.      Scattered small and large-mouthed diverticula were found in the entire       colon.      Two sessile polyps were found in the transverse colon. The polyps were 5       to 6 mm in size. These polyps were removed with a cold snare. Resection       and retrieval were complete. Estimated blood loss was minimal.      The exam was otherwise without abnormality on direct and retroflexion       views. Segmented biopsies of the ascending, descending and sigmoid       segments taken to evaluate for microscopic colitis. Impression:               - Five 5 to 6 mm polyps in the transverse colon and                            in the mid transverse colon, removed with a cold                            snare. Resected and retrieved.                            - The examination was otherwise normal on direct                            and retroflexion views. Segmental biopsies taken. Moderate Sedation:      Moderate (conscious) sedation was personally administered by an       anesthesia professional. The following parameters were monitored: oxygen       saturation, heart rate, blood pressure, respiratory rate, EKG, adequacy       of pulmonary ventilation, and response to care. Total physician       intraservice time was 20 minutes. Recommendation:           - Patient has a contact number available for                            emergencies. The signs and symptoms of potential                            delayed complications were discussed with the                            patient. Return to normal activities tomorrow.                            Written discharge instructions were provided to the                            patient.                           - Resume previous diet.                           -  Continue present medications.                           - Repeat colonoscopy date to be determined after                            pending pathology results are reviewed for                            surveillance.                           - Return to GI clinic (date not yet determined). Procedure Code(s):        --- Professional ---                           [phone], Colonoscopy, flexible; with removal of                            tumor(s), polyp(s), or other lesion(s) by snare                            technique Diagnosis Code(s):        --- Professional ---                           Z12.11, Encounter for screening for malignant                            neoplasm of colon                           D12.3, Benign neoplasm of transverse colon (hepatic                            flexure or splenic flexure) CPT copyright 2016 American [employer] Association. All rights reserved. The codes documented in this report are preliminary  and upon coder review may  be revised to meet current compliance requirements. Cristopher Estimable. Tonya Wantz, MD Norvel Richards, MD 04/18/2017 9:41:06 AM This report has been signed electronically. Number of Addenda: 0

## 2017-04-18 NOTE — H&P (Signed)
_0 @   Primary Care Physician:  Denny Levy, [place] Primary Gastroenterologist:  Dr. Gala Romney  Pre-Procedure History & Physical: HPI:  Cheyenne Hamilton is a 51 y.o. female here for screening colonoscopy.  Patient reports diarrhea all the time since she got her gallbladder out  Past Medical History:  Diagnosis Date  . Acid reflux   . Anxiety   . Complication of anesthesia    Hard to wake up    Past Surgical History:  Procedure Laterality Date  . CERVICAL CERCLAGE    . CHOLECYSTECTOMY      Prior to Admission medications   Medication Sig Start Date End Date Taking? Authorizing Provider  clobetasol cream (TEMOVATE) 1.22 % Apply 1 application topically 2 (two) times daily as needed.   Yes [provider]  clonazePAM (KLONOPIN) 0.5 MG tablet Take 0.5 mg by mouth at bedtime.    Yes [provider]  doxycycline (VIBRAMYCIN) 100 MG capsule Take 100 mg by mouth daily as needed (skin irritation).    Yes [provider]  FLUoxetine (PROZAC) 40 MG capsule Take 40 mg by mouth daily.    Yes [provider]  fluticasone (FLONASE) 50 MCG/ACT nasal spray Place 1 spray into both nostrils daily as needed for allergies or rhinitis.   Yes [provider]  folic acid (FOLVITE) 1 MG tablet Take 1 mg by mouth daily.   Yes [provider]  furosemide (LASIX) 20 MG tablet Take 20 mg by mouth daily.    Yes [provider]  ibuprofen (ADVIL,MOTRIN) 200 MG tablet Take 200 mg by mouth every 8 (eight) hours as needed for mild pain or moderate pain.   Yes [provider]  methotrexate (RHEUMATREX) 2.5 MG tablet Take 15 mg by mouth 3 (three) times a week. 6 pills every monday    Yes [provider]  Multiple Vitamin (MULTIVITAMIN) capsule Take 1 capsule by mouth daily.   Yes [provider]  Na Sulfate-K Sulfate-Mg Sulf (SUPREP BOWEL PREP KIT) 17.5-3.13-1.6 GM/177ML SOLN Take 1 kit by mouth as directed. 03/24/17  Yes Neaveh Belanger,  Cristopher Estimable, MD  Omeprazole 20 MG TBDD Take 20 mg by mouth daily.    Yes [provider]  cholestyramine (QUESTRAN) 4 g packet Take 1 packet (4 g total) by mouth 3 (three) times daily with meals. 03/03/17   [name] Stable, NP    Allergies as of 03/03/2017 - Review Complete 03/03/2017  Allergen Reaction Noted  . Hydrocodone  03/30/2012    Family History  Problem Relation Age of Onset  . Arthritis Unknown     Social History   Socioeconomic History  . Marital status: Single    Spouse name: Not on file  . Number of children: Not on file  . Years of education: 49  . Highest education level: Not on file  Social Needs  . Financial resource strain: Not on file  . Food insecurity - worry: Not on file  . Food insecurity - inability: Not on file  . Transportation needs - medical: Not on file  . Transportation needs - non-medical: Not on file  Occupational History  . Not on file  Tobacco Use  . Smoking status: Current Every Day Smoker    Packs/day: 0.50  . Smokeless tobacco: Never Used  . Tobacco comment: smoked last at 6:00am  Substance and Sexual Activity  . Alcohol use: No  . Drug use: No  . Sexual activity: Not on file  Other Topics Concern  .  Not on file  Social History Narrative  . Not on file    Review of Systems: See HPI, otherwise negative ROS  Physical Exam: BP 117/69   Pulse 85   Temp 98.3 F (36.8 C) (Oral)   Resp 20   SpO2 96%  General:   Alert,  Well-developed, well-nourished, pleasant and cooperative in NAD Skin:  Intact without significant lesions or rashes. nopathy. Lungs:  Clear throughout to auscultation.   No wheezes, crackles, or rhonchi. No acute distress. Heart:  Regular rate and rhythm; no murmurs, clicks, rubs,  or gallops. Abdomen: Non-distended, normal bowel sounds.  Soft and nontender without appreciable mass or hepatosplenomegaly.  Pulses:  Normal pulses noted. Extremities:  Without clubbing or edema.  Impression: 51 year old lady  here for screening colonoscopy.  Does have diarrhea all the time question bile salt redistribution versus microscopic colitis versus other process   Recommendations:  I have offered the patient a screening colonoscopy.  May take biopsies of the right and left colon to assess for microscopic colitis. The risks, benefits, limitations, alternatives and imponderables have been reviewed with the patient. Questions have been answered. All parties are agreeable.   Notice: This dictation was prepared with Dragon dictation along with smaller phrase technology. Any transcriptional errors that result from this process are unintentional and may not be corrected upon review.

## 2017-04-22 ENCOUNTER — Encounter: Payer: Self-pay | Admitting: Internal Medicine

## 2017-04-24 ENCOUNTER — Telehealth: Payer: Self-pay | Admitting: Internal Medicine

## 2017-04-24 NOTE — Telephone Encounter (Signed)
She can try cholestyramine 2 g daily not to be taken with in 2 hours of her other medications. Start with 2 g daily initially.

## 2017-04-24 NOTE — Telephone Encounter (Signed)
Pt notified that letter should've been mailed to her, results given. On pts discharge summary sheet it says she shouldn't start Cholestyramine quite yet. Pt would like to know when she should start this medication. Please advise.

## 2017-04-24 NOTE — Telephone Encounter (Signed)
Noted. Pt notified of how Cholestyramine should be taken.

## 2017-04-24 NOTE — Telephone Encounter (Signed)
Pt called for her colonoscopy results. Please call her at [phone]

## 2017-04-25 ENCOUNTER — Encounter (HOSPITAL_COMMUNITY): Payer: Self-pay | Admitting: Internal Medicine

## 2017-05-28 ENCOUNTER — Ambulatory Visit: Payer: 59 | Admitting: Nurse Practitioner

## 2017-05-28 ENCOUNTER — Encounter: Payer: Self-pay | Admitting: Internal Medicine

## 2017-05-28 ENCOUNTER — Telehealth: Payer: Self-pay | Admitting: Nurse Practitioner

## 2017-05-28 NOTE — Telephone Encounter (Signed)
Patient was a no show and letter sent  °

## 2017-05-28 NOTE — Progress Notes (Deleted)
Referring Provider: Denny Levy, [place] Primary Care Physician:  Denny Levy, [place] Primary GI:  Dr. Gala Romney  No chief complaint on file.   HPI:   Cheyenne Hamilton is a 51 y.o. female who presents for follow-up on diarrhea.  The patient was last seen in our office 03/03/2017 for diarrhea.  Last colonoscopy 11 years ago report not found in the system and currently due.  Admits "pretty severe IBS-D" but does not take anything because she is scared she will overshoot and lead to constipation.  This started when her gallbladder was removed.  She was previously given Bentyl but did not take it because she read somewhere can cause heatstroke.  Has 6-8 loose stools a day "and it really impacts my life."  No other GI symptoms.  Recommended colonoscopy on propofol/MAC.  Trial of Questran 4 g powder once a day.  Call in a couple weeks with progress report.  Follow-up in 3 months.  Colonoscopy completed 04/18/2017 which found five 5-6 mm polyps in the transverse colon and mid transverse colon status post removal, otherwise normal.  Segmental biopsies were also taken for any etiology of chronic diarrhea.  Pathology found the polyps to be a mix of benign polyp and tubular adenoma.  Segmental biopsies were benign colon mucosa, no indication of cause of diarrhea.  Recommended 5-year repeat colonoscopy.  Continue current medications.  Today she states   Past Medical History:  Diagnosis Date  . Acid reflux   . Anxiety   . Complication of anesthesia    Hard to wake up    Past Surgical History:  Procedure Laterality Date  . BIOPSY  04/18/2017   Procedure: BIOPSY;  Surgeon: Daneil Dolin, MD;  Location: [facility];  Service: Endoscopy;;  ascending/descending/sigmoid  . CERVICAL CERCLAGE    . CHOLECYSTECTOMY    . COLONOSCOPY WITH PROPOFOL N/A 04/18/2017   Procedure: COLONOSCOPY WITH PROPOFOL;  Surgeon: Daneil Dolin, MD;  Location: [facility];  Service: Endoscopy;  Laterality: N/A;  7:30am  .  POLYPECTOMY  04/18/2017   Procedure: POLYPECTOMY;  Surgeon: Daneil Dolin, MD;  Location: [facility];  Service: Endoscopy;;  transverse colon    Current Outpatient Medications  Medication Sig Dispense Refill  . cholestyramine (QUESTRAN) 4 g packet Take 1 packet (4 g total) by mouth 3 (three) times daily with meals. 60 each 12  . clobetasol cream (TEMOVATE) 1.61 % Apply 1 application topically 2 (two) times daily as needed.    . clonazePAM (KLONOPIN) 0.5 MG tablet Take 0.5 mg by mouth at bedtime.     Marland Kitchen doxycycline (VIBRAMYCIN) 100 MG capsule Take 100 mg by mouth daily as needed (skin irritation).     Marland Kitchen FLUoxetine (PROZAC) 40 MG capsule Take 40 mg by mouth daily.     . fluticasone (FLONASE) 50 MCG/ACT nasal spray Place 1 spray into both nostrils daily as needed for allergies or rhinitis.    . folic acid (FOLVITE) 1 MG tablet Take 1 mg by mouth daily.    . furosemide (LASIX) 20 MG tablet Take 20 mg by mouth daily.     Marland Kitchen ibuprofen (ADVIL,MOTRIN) 200 MG tablet Take 200 mg by mouth every 8 (eight) hours as needed for mild pain or moderate pain.    . methotrexate (RHEUMATREX) 2.5 MG tablet Take 15 mg by mouth 3 (three) times a week. 6 pills every monday     . Multiple Vitamin (MULTIVITAMIN) capsule Take 1 capsule by mouth daily.    Marland Kitchen  Na Sulfate-K Sulfate-Mg Sulf (SUPREP BOWEL PREP KIT) 17.5-3.13-1.6 GM/177ML SOLN Take 1 kit by mouth as directed. 1 Bottle 0  . Omeprazole 20 MG TBDD Take 20 mg by mouth daily.      No current facility-administered medications for this visit.     Allergies as of 05/28/2017 - Review Complete 04/18/2017  Allergen Reaction Noted  . Hydrocodone Other (See Comments) 03/30/2012    Family History  Problem Relation Age of Onset  . Arthritis Unknown     Social History   Socioeconomic History  . Marital status: Single    Spouse name: Not on file  . Number of children: Not on file  . Years of education: 43  . Highest education level: Not on file  Social Needs    . Financial resource strain: Not on file  . Food insecurity - worry: Not on file  . Food insecurity - inability: Not on file  . Transportation needs - medical: Not on file  . Transportation needs - non-medical: Not on file  Occupational History  . Not on file  Tobacco Use  . Smoking status: Current Every Day Smoker    Packs/day: 0.50  . Smokeless tobacco: Never Used  . Tobacco comment: smoked last at 6:00am  Substance and Sexual Activity  . Alcohol use: No  . Drug use: No  . Sexual activity: Not on file  Other Topics Concern  . Not on file  Social History Narrative  . Not on file    Review of Systems: General: Negative for anorexia, weight loss, fever, chills, fatigue, weakness. Eyes: Negative for vision changes.  ENT: Negative for hoarseness, difficulty swallowing , nasal congestion. CV: Negative for chest pain, angina, palpitations, dyspnea on exertion, peripheral edema.  Respiratory: Negative for dyspnea at rest, dyspnea on exertion, cough, sputum, wheezing.  GI: See history of present illness. GU:  Negative for dysuria, hematuria, urinary incontinence, urinary frequency, nocturnal urination.  MS: Negative for joint pain, low back pain.  Derm: Negative for rash or itching.  Neuro: Negative for weakness, abnormal sensation, seizure, frequent headaches, memory loss, confusion.  Psych: Negative for anxiety, depression, suicidal ideation, hallucinations.  Endo: Negative for unusual weight change.  Heme: Negative for bruising or bleeding. Allergy: Negative for rash or hives.   Physical Exam: There were no vitals taken for this visit. General:   Alert and oriented. Pleasant and cooperative. Well-nourished and well-developed.  Head:  Normocephalic and atraumatic. Eyes:  Without icterus, sclera clear and conjunctiva pink.  Ears:  Normal auditory acuity. Mouth:  No deformity or lesions, oral mucosa pink.  Throat/Neck:  Supple, without mass or thyromegaly. Cardiovascular:   S1, S2 present without murmurs appreciated. Normal pulses noted. Extremities without clubbing or edema. Respiratory:  Clear to auscultation bilaterally. No wheezes, rales, or rhonchi. No distress.  Gastrointestinal:  +BS, soft, non-tender and non-distended. No HSM noted. No guarding or rebound. No masses appreciated.  Rectal:  Deferred  Musculoskalatal:  Symmetrical without gross deformities. Normal posture. Skin:  Intact without significant lesions or rashes. Neurologic:  Alert and oriented x4;  grossly normal neurologically. Psych:  Alert and cooperative. Normal mood and affect. Heme/Lymph/Immune: No significant cervical adenopathy. No excessive bruising noted.    05/28/2017 1:17 PM   Disclaimer: This note was dictated with voice recognition software. Similar sounding words can inadvertently be transcribed and may not be corrected upon review.

## 2017-05-30 NOTE — Telephone Encounter (Signed)
Noted  

## 2017-08-21 ENCOUNTER — Telehealth: Payer: Self-pay | Admitting: Internal Medicine

## 2017-08-21 ENCOUNTER — Telehealth: Payer: Self-pay | Admitting: General Practice

## 2017-08-21 NOTE — Telephone Encounter (Signed)
-----   Message from Theadora Rama sent at 08/21/2017  2:30 PM EDT ----- Regarding: question about coding  Pt had colonoscopy recently and said her insurance said it would be 100% covered since she went in as a screening. She said that now she has a bill for $4000. I asked if she had a polyp removed and she said yes, but said that she went in as a screening and it should be coded as a screening. I told her that I would have my office manager call her back. [phone]

## 2017-08-21 NOTE — Telephone Encounter (Deleted)
Pt had colonoscopy recently and was under the impression that it would be 100% covered since it was a screening. She has questions about why she received a bill for $4000. Please call her at [phone]

## 2017-08-21 NOTE — Telephone Encounter (Signed)
I spoke with the billing department and she stated that the claim was denied, because of no PA on file.  I provided Elmyra Ricks with the PA for the tcs and she is going to call [facility] and get back to me.

## 2017-09-01 NOTE — Telephone Encounter (Signed)
Per billing she had a deductible she needed to meet for the HB side.

## 2017-09-01 NOTE — Telephone Encounter (Signed)
I tried to call the patient to give her an update, no answer, lmom

## 2017-09-01 NOTE — Telephone Encounter (Signed)
Per billing we are appealing the denial on the PB side, because an authorization was obtained.

## 2017-09-03 NOTE — Telephone Encounter (Signed)
I spoke with the patient and made her aware about the HB side and that we are waiting a response from [facility] in regards to the PB review.

## 2018-03-06 ENCOUNTER — Ambulatory Visit: Payer: 59 | Admitting: Neurology

## 2018-03-06 ENCOUNTER — Encounter: Payer: Self-pay | Admitting: Neurology

## 2018-03-06 VITALS — BP 135/88 | HR 73 | Ht 64.0 in | Wt 274.0 lb

## 2018-03-06 DIAGNOSIS — Z6841 Body Mass Index (BMI) 40.0 and over, adult: Secondary | ICD-10-CM

## 2018-03-06 DIAGNOSIS — G478 Other sleep disorders: Secondary | ICD-10-CM

## 2018-03-06 DIAGNOSIS — G2581 Restless legs syndrome: Secondary | ICD-10-CM

## 2018-03-06 DIAGNOSIS — R519 Headache, unspecified: Secondary | ICD-10-CM

## 2018-03-06 DIAGNOSIS — R609 Edema, unspecified: Secondary | ICD-10-CM

## 2018-03-06 DIAGNOSIS — F458 Other somatoform disorders: Secondary | ICD-10-CM

## 2018-03-06 DIAGNOSIS — G4719 Other hypersomnia: Secondary | ICD-10-CM | POA: Diagnosis not present

## 2018-03-06 DIAGNOSIS — R0683 Snoring: Secondary | ICD-10-CM

## 2018-03-06 DIAGNOSIS — R51 Headache: Secondary | ICD-10-CM | POA: Diagnosis not present

## 2018-03-06 DIAGNOSIS — G4761 Periodic limb movement disorder: Secondary | ICD-10-CM

## 2018-03-06 NOTE — Progress Notes (Signed)
Subjective:    Patient ID: Cheyenne Hamilton is a 52 y.o. female.  HPI     Star Age, MD, PhD [facility] [address], [address]O. [place], [place] [address]  Dear Jeannetta Nap,   I saw your patient, Cheyenne Hamilton, upon your kind request in my sleep clinic today for initial consultation of her sleep disorder, in particular, concern for underlying obstructive sleep apnea. The patient is unaccompanied today. As you know, Cheyenne Hamilton is a 52 year old right-handed woman with an underlying medical history of reflux disease, anxiety, depression, psoriasis, smoking, vitamin D deficiency and morbid obesity with BMI of over 45, who reports snoring and excessive daytime somnolence. I reviewed your office note 01/28/2018, which you kindly included. She had recent blood work through your office which I reviewed. She had elevated triglycerides. Vitamin D level was low around 24. TSH was normal. Her [name] is 8 out of 24, fatigue score is 25 out of 63. 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 She smokes about half a pack per day, sometimes  pack over a week, and drinks alcohol rarely, caffeine in the form of coffee, one cup per day in the morning on average.   Her biggest concern regarding her sleep is nonrestorative sleep and restless sleep. She grinds her teeth at night. She has had an oral appliance for the past 5 or 6 years through her workplace. She has never had a sleep study to evaluate her pre-or post dental device. She denies nocturia on a day-to-day basis. She has had some morning headaches and has had intermittent restless leg symptoms and leg movements at night. Of note, she endorses stress and OCD symptoms. She has been on medication for years. She was on Paxil initially for many years, then on Prozac for about 7 years and more recently about 6 weeks ago was switched to sertraline. She has had weight gain on her SSRI  medications.  Her Past Medical History Is Significant For: Past Medical History:  Diagnosis Date  . Acid reflux   . Anxiety   . Complication of anesthesia    Hard to wake up    Her Past Surgical History Is Significant For: Past Surgical History:  Procedure Laterality Date  . BIOPSY  04/18/2017   Procedure: BIOPSY;  Surgeon: Daneil Dolin, MD;  [facility];  Service: Endoscopy;;  ascending/descending/sigmoid  . CERVICAL CERCLAGE    . CHOLECYSTECTOMY    . COLONOSCOPY WITH PROPOFOL N/A 04/18/2017   Procedure: COLONOSCOPY WITH PROPOFOL;  Surgeon: Daneil Dolin, MD;  Location: [facility];  Service: Endoscopy;  Laterality: N/A;  7:30am  . POLYPECTOMY  04/18/2017   Procedure: POLYPECTOMY;  Surgeon: Daneil Dolin, MD;  Location: [facility];  Service: Endoscopy;;  transverse colon    Her Family History Is Significant For: Family History  Problem Relation Age of Onset  . Arthritis Unknown     Her Social History Is Significant For: Social History   Socioeconomic History  . Marital status: Single    Spouse name: Not on file  . Number of children: Not on file  . Years of education: 67  . Highest education level: Not on file  Occupational History  . Not on file  Social Needs  . Financial resource strain: Not on file  . Food insecurity:    Worry: Not on file    Inability: Not on file  . Transportation  needs:    Medical: Not on file    Non-medical: Not on file  Tobacco Use  . Smoking status: Current Every Day Smoker    Packs/day: 0.50  . Smokeless tobacco: Never Used  . Tobacco comment: smoked last at 6:00am  Substance and Sexual Activity  . Alcohol use: No  . Drug use: No  . Sexual activity: Not on file  Lifestyle  . Physical activity:    Days per week: Not on file    Minutes per session: Not on file  . Stress: Not on file  Relationships  . Social connections:    Talks on phone: Not on file    Gets together: Not on file    Attends religious  service: Not on file    Active member of club or organization: Not on file    Attends meetings of clubs or organizations: Not on file    Relationship status: Not on file  Other Topics Concern  . Not on file  Social History Narrative  . Not on file    Her Allergies Are:  Allergies  Allergen Reactions  . Hydrocodone Other (See Comments)    Pt states made her have a rapid heart beat  :   Her Current Medications Are:  Outpatient Encounter Medications as of 03/06/2018  Medication Sig  . cholecalciferol (VITAMIN D) 1000 units tablet Take 1,000 Units by mouth daily.  . clobetasol cream (TEMOVATE) 2.22 % Apply 1 application topically 2 (two) times daily as needed.  . clonazePAM (KLONOPIN) 0.5 MG tablet Take 1 mg by mouth at bedtime.   Marland Kitchen doxycycline (VIBRAMYCIN) 100 MG capsule Take 100 mg by mouth daily as needed (skin irritation).   . fluticasone (FLONASE) 50 MCG/ACT nasal spray Place 1 spray into both nostrils daily as needed for allergies or rhinitis.  . folic acid (FOLVITE) 1 MG tablet Take 1 mg by mouth daily.  . furosemide (LASIX) 20 MG tablet Take 20 mg by mouth daily.   Marland Kitchen ibuprofen (ADVIL,MOTRIN) 200 MG tablet Take 200 mg by mouth every 8 (eight) hours as needed for mild pain or moderate pain.  . methotrexate (RHEUMATREX) 2.5 MG tablet Take 15 mg by mouth 3 (three) times a week. 6 pills every monday   . Multiple Vitamin (MULTIVITAMIN) capsule Take 1 capsule by mouth daily.  . Omeprazole 20 MG TBDD Take 20 mg by mouth daily.   . sertraline (ZOLOFT) 100 MG tablet Take 100 mg by mouth daily.  . [DISCONTINUED] cholestyramine (QUESTRAN) 4 g packet Take 1 packet (4 g total) by mouth 3 (three) times daily with meals.  . [DISCONTINUED] Na Sulfate-K Sulfate-Mg Sulf (SUPREP BOWEL PREP KIT) 17.5-3.13-1.6 GM/177ML SOLN Take 1 kit by mouth as directed.   No facility-administered encounter medications on file as of 03/06/2018.   :  Review of Systems:  Out of a complete 14 point review of  systems, all are reviewed and negative with the exception of these symptoms as listed below: Review of Systems  Neurological:       Pt presents today to discuss her sleep. Pt has never had a sleep study but does endorse snoring.  Epworth Sleepiness Scale 0= would never doze 1= slight chance of dozing 2= moderate chance of dozing 3= high chance of dozing  Sitting and reading: 3 Watching TV: 2 Sitting inactive in a public place (ex. Theater or meeting): 1 As a passenger in a car for an hour without a break: 1 Lying down to rest in the  afternoon: 1 Sitting and talking to someone: 0 Sitting quietly after lunch (no alcohol): 0 In a car, while stopped in traffic: 0 Total: 8     Objective:  Neurological Exam  Physical Exam Physical Examination:   Vitals:   03/06/18 1114  BP: 135/88  Pulse: 73    General Examination: The patient is a very pleasant 52 y.o. female in no acute distress. She appears well-developed and well-nourished and well groomed.   HEENT: Normocephalic, atraumatic, pupils are equal, round and reactive to light and accommodation. Extraocular tracking is good without limitation to gaze excursion or nystagmus noted. Normal smooth pursuit is noted. Hearing is grossly intact. Face is symmetric with normal facial animation and normal facial sensation. Speech is clear with no dysarthria noted. There is no hypophonia. There is no lip, neck/head, jaw or voice tremor. Neck is supple with full range of passive and active motion. There are no carotid bruits on auscultation. Oropharynx exam reveals: mild mouth dryness, adequate dental hygiene and moderate airway crowding, due to smaller airway, redundant soft palate, tonsils of 1+. Mallampati is class III. Tongue protrudes centrally and palate elevates symmetrically. Neck size is 16 7/8 inches. She has a minimal overbite.   Chest: Clear to auscultation without wheezing, rhonchi or crackles noted.  Heart: S1+S2+0, regular and  normal without murmurs, rubs or gallops noted.   Abdomen: Soft, non-tender and non-distended with normal bowel sounds appreciated on auscultation.  Extremities: There is trace pitting edema in the distal lower extremities bilaterally. Pedal pulses are intact.  Skin: Warm and dry without trophic changes noted.  Musculoskeletal: exam reveals no obvious joint deformities, tenderness or joint swelling or erythema.   Neurologically:  Mental status: The patient is awake, alert and oriented in all 4 spheres. Her immediate and remote memory, attention, language skills and fund of knowledge are appropriate. There is no evidence of aphasia, agnosia, apraxia or anomia. Speech is clear with normal prosody and enunciation. Thought process is linear. Mood is normal and affect is normal.  Cranial nerves II - XII are as described above under HEENT exam. In addition: shoulder shrug is normal with equal shoulder height noted. Motor exam: Normal bulk, strength and tone is noted. There is no drift, tremor or rebound. Reflexes are 1+. Fine motor skills and coordination: intact with normal finger taps, normal hand movements, normal rapid alternating patting, normal foot taps and normal foot agility.  Cerebellar testing: No dysmetria or intention tremor on finger to nose testing. Heel to shin is unremarkable bilaterally. There is no truncal or gait ataxia.  Sensory exam: intact to light touch.  Gait, station and balance: She stands easily. No veering to one side is noted. No leaning to one side is noted. Posture is age-appropriate and stance is narrow based. Gait shows normal stride length and normal pace. No problems turning are noted.     Assessment and Plan:  In summary, Cheyenne Hamilton is a very pleasant 52 y.o.-year old female with an underlying medical history of reflux disease, anxiety, depression, psoriasis, smoking, vitamin D deficiency and morbid obesity with BMI of over 80, whose history and physical exam are  concerning for obstructive sleep apnea (OSA). She also endorses intermittent Sx of RLS and leg movements while asleep (likely PLMs).  I had a long chat with the patient about my findings and the diagnosis of OSA, its prognosis and treatment options. We talked about medical treatments, surgical interventions and non-pharmacological approaches. I explained in particular the risks and ramifications  of untreated moderate to severe OSA, especially with respect to developing cardiovascular disease down the Road, including congestive heart failure, difficult to treat hypertension, cardiac arrhythmias, or stroke. Even type 2 diabetes has, in part, been linked to untreated OSA. Symptoms of untreated OSA include daytime sleepiness, memory problems, mood irritability and mood disorder such as depression and anxiety, lack of energy, as well as recurrent headaches, especially morning headaches. We talked about smoking cessation and trying to maintain a healthy lifestyle in general, as well as the importance of weight control. I encouraged the patient to eat healthy, exercise daily and keep well hydrated, to keep a scheduled bedtime and wake time routine, to not skip any meals and eat healthy snacks in between meals. I advised the patient not to drive when feeling sleepy. I recommended the following at this time: sleep study with potential positive airway pressure titration. (We will score hypopneas at 4%).   I explained the sleep test procedure to the patient and also outlined possible surgical and non-surgical treatment options of OSA, including the use of a custom-made dental device (which would require a referral to a specialist dentist or oral surgeon), upper airway surgical options, such as pillar implants, radiofrequency surgery, tongue base surgery, and UPPP (which would involve a referral to an ENT surgeon). Rarely, jaw surgery such as mandibular advancement may be considered.  I also explained the CPAP treatment  option to the patient, who indicated that she would be willing to try CPAP or autoPAP or her dental device with adjustment, if the need arises. I explained the importance of being compliant with PAP treatment, not only for insurance purposes but primarily to improve Her symptoms, and for the patient's long term health benefit, including to reduce Her cardiovascular risks. I answered all her questions today and the patient was in agreement. I would like to see her back after the sleep study is completed and encouraged her to call with any interim questions, concerns, problems or updates.   Thank you very much for allowing me to participate in the care of this nice patient. If I can be of any further assistance to you please do not hesitate to call me at [phone].  Sincerely,   Star Age, MD, PhD

## 2018-03-06 NOTE — Patient Instructions (Signed)

## 2018-03-16 ENCOUNTER — Telehealth: Payer: Self-pay

## 2018-03-16 DIAGNOSIS — G4719 Other hypersomnia: Secondary | ICD-10-CM

## 2018-03-16 NOTE — Telephone Encounter (Signed)
[employer] denied in lab sleep study, need HST order 

## 2018-03-16 NOTE — Telephone Encounter (Signed)
VO for HST from Dr. Athar received. HST order placed.  

## 2018-06-24 ENCOUNTER — Ambulatory Visit (INDEPENDENT_AMBULATORY_CARE_PROVIDER_SITE_OTHER): Payer: 59 | Admitting: Neurology

## 2018-06-24 DIAGNOSIS — G4719 Other hypersomnia: Secondary | ICD-10-CM

## 2018-06-24 DIAGNOSIS — G4733 Obstructive sleep apnea (adult) (pediatric): Secondary | ICD-10-CM

## 2018-06-29 ENCOUNTER — Telehealth: Payer: Self-pay

## 2018-06-29 NOTE — Progress Notes (Signed)
Patient referred by Ms. [place], [place], seen by me on 03/06/18, HST on 06/24/18.    Please call and notify the patient that the recent home sleep test showed obstructive sleep apnea in the severe range. While I recommend treatment for this in the form CPAP, her insurance will not approve a sleep study for this. They will likely only approve a trial of autoPAP, which means, that we don't have to bring her in for a sleep study with CPAP, but will let her try an autoPAP machine at home, through a DME company (of her choice, or as per insurance requirement). The DME representative will educate her on how to use the machine, how to put the mask on, etc. I have placed an order in the chart. Please send referral, talk to patient, send report to referring MD. We will need a FU in sleep clinic for 10 weeks post-PAP set up, please arrange that with me or one of our NPs. Thanks,   Star Age, MD, PhD [facility])

## 2018-06-29 NOTE — Procedures (Signed)
[facility] @[facility] [address] [place], [place] [address] NAME:  Cheyenne Hamilton                                                                           DOB: 20-Jun-1965 MEDICAL RECORD no:  [id number]                                                       DOS:  06/24/2018 REFERRING PHYSICIAN: Denny Levy, PA STUDY PERFORMED: Home Sleep Test on Watch [facility] HISTORY: 53 year old woman with a history of reflux disease, anxiety, depression, psoriasis, smoking, vitamin D deficiency and morbid obesity, who reports snoring and excessive daytime somnolence. Her [name] is 8 out of 24, BMI of 46.7. STUDY RESULTS:   Total Recording Time: 7 hrs, 15 mins; Total Sleep Time:  6 hrs, 3 mins Total Apnea/Hypopnea Index (AHI):  64.8/h; RDI: 65.3 /h; REM AHI: 54.6/h Average Oxygen Saturation: 91 %; Lowest Oxygen Desaturation: 81%  Total Time Oxygen Saturation Below or at 88%:  17.2 minutes  Average Heart Rate:  91 bpm (between 46 and 106 bpm) IMPRESSION: OSA  RECOMMENDATION: This home sleep test demonstrates severe obstructive sleep apnea with a total AHI of 64.8/hour and O2 nadir of 81%. Treatment with positive airway pressure (in the form of CPAP) is recommended. This will require a full night CPAP titration study for proper treatment settings, O2 monitoring and mask fitting. Based on the severity of the sleep disordered breathing, an attended titration study is indicated. However, the patient's insurance has denied an attended sleep study; therefore, the patient will be advised to proceed with an autoPAP titration/trial at home for now. Please note that untreated obstructive sleep apnea may carry additional perioperative morbidity. Patients with significant obstructive sleep apnea should receive perioperative PAP therapy and the surgeons and particularly the anesthesiologist should be informed of the diagnosis and the severity of the sleep disordered breathing. The  patient should be cautioned not to drive, work at heights, or operate dangerous or heavy equipment when tired or sleepy. Review and reiteration of good sleep hygiene measures should be pursued with any patient. Other causes of the patient's symptoms, including circadian rhythm disturbances, an underlying mood disorder, medication effect and/or an underlying medical problem cannot be ruled out based on this test. Clinical correlation is recommended. The patient and her referring provider will be notified of the test results. The patient will be seen in follow up in sleep clinic at [facility]. I certify that I have reviewed the raw data recording prior to the issuance of this report in accordance with the standards of the American Academy of Sleep Medicine (AASM).  Star Age, MD, PhD [facility]) Diplomat, ABPN (Neurology and Sleep)

## 2018-06-29 NOTE — Addendum Note (Signed)
Addended by: Star Age on: 06/29/2018 08:33 AM   Modules accepted: Orders

## 2018-06-29 NOTE — Telephone Encounter (Signed)
-----   Message from Star Age, MD sent at 06/29/2018  8:32 AM EST ----- Patient referred by Ms. [place], [place], seen by me on 03/06/18, HST on 06/24/18.    Please call and notify the patient that the recent home sleep test showed obstructive sleep apnea in the severe range. While I recommend treatment for this in the form CPAP, her insurance will not approve a sleep study for this. They will likely only approve a trial of autoPAP, which means, that we don't have to bring her in for a sleep study with CPAP, but will let her try an autoPAP machine at home, through a DME company (of her choice, or as per insurance requirement). The DME representative will educate her on how to use the machine, how to put the mask on, etc. I have placed an order in the chart. Please send referral, talk to patient, send report to referring MD. We will need a FU in sleep clinic for 10 weeks post-PAP set up, please arrange that with me or one of our NPs. Thanks,   Star Age, MD, PhD [facility])

## 2018-06-29 NOTE — Telephone Encounter (Signed)
I called pt to discuss her sleep study results. No answer, left a message asking her to call me back. 

## 2018-06-30 NOTE — Telephone Encounter (Signed)
Pt returned call. RN not available. Please call pt back at work num as soon as available.

## 2018-06-30 NOTE — Telephone Encounter (Signed)
I called pt, had a very extended conversation with her.   She is very concerned about her diagnosis of severe osa. She wants to know if we can try and get an in lab cpap titration study approved. She is wondering if the HST was valid. She was actually wearing a "snore guard" the night of her HST. I discussed the results of her HST again with her, including TRT, TST, AHI, and REM AHI data. She wants me to mail a copy of her HST to her home address and I verified with her that her address on file is correct.  Pt would like our sleep lab to call her and discuss if it is worth trying to get a cpap titration study approved. If so, she would like to try this first. She says that you may leave a detailed message on her cell phone number, per DPR.

## 2018-06-30 NOTE — Telephone Encounter (Signed)
Pt has returned call to RN to discuss if the auto pap and what is and what is not covered by insurance.  Pt will be available at work until [place] (work [phone]) if call is on tomorrow pt wants RN to know she goes to lunch from 1-2 and to call her on her mobile if call is made within that time frame

## 2018-06-30 NOTE — Telephone Encounter (Signed)
I called pt. I advised pt that Dr. Rexene Alberts reviewed their sleep study results and found that pt has severe osa. Dr. Rexene Alberts recommends that pt start an auto pap at home. I reviewed PAP compliance expectations with the pt. Pt is agreeable to starting an auto-PAP. I advised pt that an order will be sent to a DME, [facility], and [facility] will call the pt within about one week after they file with the pt's insurance. [facility] will show the pt how to use the machine, fit for masks, and troubleshoot the auto-PAP if needed. A follow up appt was made for insurance purposes with Dr. Rexene Alberts on 09/30/2018 at 1:00pm. Pt verbalized understanding to arrive 15 minutes early and bring their auto-PAP. A letter with all of this information in it will be mailed to the pt as a reminder. I verified with the pt that the address we have on file is correct. Pt verbalized understanding of results. Pt had no questions at this time but was encouraged to call back if questions arise. I have sent the order to [facility] and have received confirmation that they have received the order.

## 2018-08-31 ENCOUNTER — Telehealth: Payer: Self-pay | Admitting: Neurology

## 2018-08-31 NOTE — Telephone Encounter (Signed)
Pt is wanting to r/s the appt on 4/1. Please call to discuss

## 2018-08-31 NOTE — Telephone Encounter (Signed)
Due to current COVID 19 pandemic, our office is severely reducing in office visits for at least the next 2 weeks, in order to minimize the risk to our patients and healthcare providers. I called pt and discussed this with her. She is agreeable to a telephone visit instead.  Pt understands that although there may be some limitations with this type of visit, we will take all precautions to reduce any security or privacy concerns.  Pt understands that this will be treated like an in office visit and we will file with pt's insurance, and there may be a patient responsible charge related to this service. Pt advised me that she checked with her insurance and she does need at 31-90 day follow up post cpap start. Pt is reluctant to pay her copay and I advised her again that I'm not sure what insurance will decide regarding copays and telephone visits, this is new to our office, and the rules may change daily. Pt was scheduled for a telephone visit with Dr. Rexene Alberts tomorrow at 10:30am. Pt verbalized understanding of all the above and of appt date and time.

## 2018-09-01 ENCOUNTER — Other Ambulatory Visit: Payer: Self-pay

## 2018-09-01 ENCOUNTER — Telehealth (INDEPENDENT_AMBULATORY_CARE_PROVIDER_SITE_OTHER): Payer: 59 | Admitting: Neurology

## 2018-09-01 DIAGNOSIS — G4733 Obstructive sleep apnea (adult) (pediatric): Secondary | ICD-10-CM

## 2018-09-01 DIAGNOSIS — Z9989 Dependence on other enabling machines and devices: Principal | ICD-10-CM

## 2018-09-01 NOTE — Progress Notes (Signed)
[name] is a 53 year old right-handed woman with an underlying medical history of reflux disease, anxiety, depression, psoriasis, smoking, vitamin D deficiency and morbid obesity with BMI of over 74, with whom I am conducting a phone visit today (see below). We are discussing her OSA and treatment with autoPAP. The patient is unaccompanied today. I first met her on 03/06/2018 at the request of her primary care PA, at which time the patient reported snoring and excessive daytime somnolence, with an [name] of 8 out of 24 at the time. She was advised to proceed with a sleep study. Her insurance denied a laboratory attended sleep study. She had a home sleep test on 06/24/2018 which indicated severe sleep apnea with an AHI of 64.8 per hour, average oxygen saturation of 91%, nadir of 81%, with time below or at 88% saturation of 17.2 minutes. She was advised to proceed with AutoPap therapy at home.  Today, 09/01/2018: Please also see below for phone call visit.   I reviewed her AutoPap compliance data from 08/01/2018 through 08/30/2018, which is a total of 30 days, during which time she used her machine 29 days with percent used days greater than 4 hours at 93%, indicating excellent compliance with an average usage of 6 hours and 46 minutes, residual AHI at goal at 0.3 per hour, leak on the higher end with the 95th percentile at 20.9 L/m, 95th percentile pressure at 13.3 cm with a range of 7 cm to 14 cm with EPR.  Previously:  03/06/2018: (She) reports snoring and excessive daytime somnolence. I reviewed your office note 01/28/2018, which you kindly included. She had recent blood work through your office which I reviewed. She had elevated triglycerides. Vitamin D level was low around 24. TSH was normal. Her [name] is 8 out of 24, fatigue score is 25 out of 63. 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 She smokes about half a pack per day,  sometimes  pack over a week, and drinks alcohol rarely, caffeine in the form of coffee, one cup per day in the morning on average.   Her biggest concern regarding her sleep is nonrestorative sleep and restless sleep. She grinds her teeth at night. She has had an oral appliance for the past 5 or 6 years through her workplace. She has never had a sleep study to evaluate her pre-or post dental device. She denies nocturia on a day-to-day basis. She has had some morning headaches and has had intermittent restless leg symptoms and leg movements at night. Of note, she endorses stress and OCD symptoms. She has been on medication for years. She was on Paxil initially for many years, then on Prozac for about 7 years and more recently about 6 weeks ago was switched to sertraline. She has had weight gain on her SSRI medications.  Virtual Visit via Telephone Note on 09/01/2018  I connected with Cheyenne Hamilton on 09/01/18 at 10:30 AM EDT by telephone and verified that I am speaking with the correct person using two identifiers (name, DOB, called her personal cellph).   I discussed the limitations, risks, security and privacy concerns of performing an evaluation and management service by telephone and the availability of in person appointments. I also discussed with the patient that there may be a patient responsible charge related to this service. The patient expressed understanding and agreed to proceed.  History of Present Illness: See above discussion.  She reports that she  has been on the AutoPap machine for about 55 days, and in the first several days she felt better. She has since then had more issues with restless sleep, tossing and turning and dislodging of the mask. She spent one night in the recliner which was the skip night on the compliance data range. She does admit that she overall wakes up better rested and that she is motivated to continue with treatment as she understands that severe sleep apnea  should be treated. In fact, she adds that she lost a very good friend to complications of sleep apnea. She has been using a ResMed fullface mask (likely Airfit F10 or F20). However, she is a side sleeper and sometimes that dislodges her mask at night. She denies any telltale symptoms of restless leg syndrome. She has no significant nocturia. She has been in touch with her DME company and she was reassured that it can take about 3 months to get used to using CPAP and benefit from it. I explained to her that it could take even a little longer than that. She is tracking her usage via cell phone app.    Observations/Objective:  She reports that she is overall doing well with her machine, she is still working on sleeping 7-8 hours with it. She has purchased a wedge pillow to allow her to sleep in one position more consistently but sometimes a wedge pillow ends up on the floor and she wakes up without realizing that the pillow landed on the floor. She admits that she actually falls asleep a little quicker now that she is on treatment and she overall wakes up better rested.  Assessment and Plan:  In summary, Cheyenne Hamilton is a very pleasant 53 year old female with an underlying medical history of reflux disease, anxiety, depression, psoriasis, smoking, vitamin D deficiency and morbid obesity, who has been diagnosed with severe obstructive sleep apnea based on a home sleep test on 06/24/2018. We talked about her home test results today, reviewed her compliance data for the past 30 days. She is highly commended for treatment adherence which is excellent and sleep apnea is under very good control on the current settings. She has noticed some benefit in that she falls asleep quicker wakes up better rested but her sleep is interrupted and restless, some nights worse than others. She is motivated to continue with treatment and encouraged to be fully compliant with her CPAP. I would like to suggest a clinic follow-up in  3-4 months, she would preferred July or even early August just to be sure. She is encouraged to call for any interim questions or concerns. I answered all her questions today and she demonstrated understanding of the plan and voiced her agreement.  Star Age, MD, PhD   Follow Up Instructions:   I discussed the assessment and treatment plan with the patient. The patient was provided an opportunity to ask questions and all were answered. The patient agreed with the plan and demonstrated an understanding of the instructions.   The patient was advised to call back or seek an in-person evaluation if the symptoms worsen or if the condition fails to improve as anticipated.  I provided 21 minutes of non-face-to-face time during this encounter.   Star Age, MD

## 2018-09-09 ENCOUNTER — Ambulatory Visit: Payer: 59 | Admitting: Family Medicine

## 2018-09-30 ENCOUNTER — Ambulatory Visit: Payer: Self-pay | Admitting: Neurology

## 2018-12-17 ENCOUNTER — Telehealth: Payer: Self-pay | Admitting: Radiology

## 2018-12-17 NOTE — Telephone Encounter (Signed)
Received phone call from patient's friend.  Patient is in sever pain with her back, cannot wait until tomorrow to be seen in our clinic.  Advised to go to [facility] ED for treatment.

## 2019-01-06 ENCOUNTER — Other Ambulatory Visit: Payer: Self-pay

## 2019-01-06 ENCOUNTER — Ambulatory Visit (INDEPENDENT_AMBULATORY_CARE_PROVIDER_SITE_OTHER): Payer: 59 | Admitting: Neurology

## 2019-01-06 ENCOUNTER — Encounter: Payer: Self-pay | Admitting: Neurology

## 2019-01-06 ENCOUNTER — Telehealth: Payer: Self-pay | Admitting: Neurology

## 2019-01-06 VITALS — BP 162/98 | HR 90 | Ht 64.0 in | Wt 291.0 lb

## 2019-01-06 DIAGNOSIS — G4733 Obstructive sleep apnea (adult) (pediatric): Secondary | ICD-10-CM | POA: Diagnosis not present

## 2019-01-06 DIAGNOSIS — Z9989 Dependence on other enabling machines and devices: Secondary | ICD-10-CM | POA: Diagnosis not present

## 2019-01-06 NOTE — Patient Instructions (Addendum)
Please continue using your autoPAP regularly. While your insurance requires that you use PAP at least 4 hours each night on 70% of the nights, I recommend, that you not skip any nights and use it throughout the night if you can. Getting used to PAP and staying with the treatment long term does take time and patience and discipline. Untreated obstructive sleep apnea when it is moderate to severe can have an adverse impact on cardiovascular health and raise her risk for heart disease, arrhythmias, hypertension, congestive heart failure, stroke and diabetes. Untreated obstructive sleep apnea causes sleep disruption, nonrestorative sleep, and sleep deprivation. This can have an impact on your day to day functioning and cause daytime sleepiness and impairment of cognitive function, memory loss, mood disturbance, and problems focussing. Using PAP regularly can improve these symptoms.  Keep up the good work! I will see you in one year. Please work on weight loss and call us anytime, if you would like for me to look at your download in the interim.

## 2019-01-06 NOTE — Telephone Encounter (Signed)
At [place], pt. States she will call back to schedule 1 yr f/u from 01/06/19

## 2019-01-06 NOTE — Progress Notes (Signed)
Subjective:    Patient ID: Cheyenne Hamilton is a 53 y.o. female.  HPI     Interim history:   Cheyenne Hamilton is a 53 year old right-handed woman with an underlying medical history of reflux disease, anxiety, depression, psoriasis, smoking, vitamin D deficiency and morbid obesity with BMI of over 10, who presents for follow-up consultation of her obstructive sleep apnea, on AutoPap therapy.  The patient is unaccompanied today.  I last spoke to her on 09/01/2018 in a virtual visit, at which time she was compliant with her AutoPap but was recently experiencing more restless sleep.  She was motivated to continue with treatment.    Today, 01/06/2019:   I reviewed her AutoPap compliance data from 12/06/2018 through 01/04/2019 which is a total of 30 days, during which time she used her machine 29 days with percent used days greater than 4 hours at 93%, indicating excellent compliance with an average usage of 7 hours and 22 minutes, residual AHI at goal at 0.7/h, 95th percentile of pressure at 13.9 cm with a range of 7 cm to 14 cm with EPR, leak on the low side with a 95th percentile at 4.8 L/min.  She reports having adjusted well to treatment.  Her set up date was 07/08/2018.  She continues to use a full facemask.  She is typically a side sleeper.  She does track her usage on the cell phone app.  She needs new supplies.  She has been very mindful about the cleaning of her equipment but has not changed the filter recently.  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  Unfortunately, she has gained weight in the past 3 months.   The patient's allergies, current medications, family history, past medical history, past social history, past surgical history and problem list were reviewed and updated as appropriate.   Previously:   I first met her on 03/06/2018 at the request of her primary care PA, at which time the patient reported snoring  and excessive daytime somnolence, with an [name] of 8 out of 24 at the time. She was advised to proceed with a sleep study. Her insurance denied a laboratory attended sleep study. She had a home sleep test on 06/24/2018 which indicated severe sleep apnea with an AHI of 64.8 per hour, average oxygen saturation of 91%, nadir of 81%, with time below or at 88% saturation of 17.2 minutes. She was advised to proceed with AutoPap therapy at home.     I reviewed her AutoPap compliance data from 08/01/2018 through 08/30/2018, which is a total of 30 days, during which time she used her machine 29 days with percent used days greater than 4 hours at 93%, indicating excellent compliance with an average usage of 6 hours and 46 minutes, residual AHI at goal at 0.3 per hour, leak on the higher end with the 95th percentile at 20.9 L/m, 95th percentile pressure at 13.3 cm with a range of 7 cm to 14 cm with EPR.    03/06/2018: (She) reports snoring and excessive daytime somnolence. I reviewed your office note 01/28/2018, which you kindly included. She had recent blood work through your office which I reviewed. She had elevated triglycerides. Vitamin D level was low around 24. TSH was normal. Her [name] is 8 out of 24, fatigue score is 25 out of 63. She is single, works at a Environmental education officer, lives with her  fianc, no children. She smokes about half a pack per day, sometimes  pack over a week, and drinks alcohol rarely, caffeine in the form of coffee, one cup per day in the morning on average.   Her biggest concern regarding her sleep is nonrestorative sleep and restless sleep. She grinds her teeth at night. She has had an oral appliance for the past 5 or 6 years through her workplace. She has never had a sleep study to evaluate her pre-or post dental device. She denies nocturia on a day-to-day basis. She has had some morning headaches and has had intermittent restless leg symptoms and leg  movements at night. Of note, she endorses stress and OCD symptoms. She has been on medication for years. She was on Paxil initially for many years, then on Prozac for about 7 years and more recently about 6 weeks ago was switched to sertraline. She has had weight gain on her SSRI medications.  Her Past Medical History Is Significant For: Past Medical History:  Diagnosis Date  . Acid reflux   . Anxiety   . Complication of anesthesia    Hard to wake up    Her Past Surgical History Is Significant For: Past Surgical History:  Procedure Laterality Date  . BIOPSY  04/18/2017   Procedure: BIOPSY;  Surgeon: Daneil Dolin, MD;  Location: [facility];  Service: Endoscopy;;  ascending/descending/sigmoid  . CERVICAL CERCLAGE    . CHOLECYSTECTOMY    . COLONOSCOPY WITH PROPOFOL N/A 04/18/2017   Procedure: COLONOSCOPY WITH PROPOFOL;  Surgeon: Daneil Dolin, MD;  Location: [facility];  Service: Endoscopy;  Laterality: N/A;  7:30am  . POLYPECTOMY  04/18/2017   Procedure: POLYPECTOMY;  Surgeon: Daneil Dolin, MD;  Location: [facility];  Service: Endoscopy;;  transverse colon    Her Family History Is Significant For: Family History  Problem Relation Age of Onset  . Arthritis Unknown     Her Social History Is Significant For: Social History   Socioeconomic History  . Marital status: Single    Spouse name: Not on file  . Number of children: Not on file  . Years of education: 97  . Highest education level: Not on file  Occupational History  . Not on file  Social Needs  . Financial resource strain: Not on file  . Food insecurity    Worry: Not on file    Inability: Not on file  . Transportation needs    Medical: Not on file    Non-medical: Not on file  Tobacco Use  . Smoking status: Current Every Day Smoker    Packs/day: 0.50  . Smokeless tobacco: Never Used  . Tobacco comment: smoked last at 6:00am  Substance and Sexual Activity  . Alcohol use: No  . Drug use: No  .  Sexual activity: Not on file  Lifestyle  . Physical activity    Days per week: Not on file    Minutes per session: Not on file  . Stress: Not on file  Relationships  . Social Herbalist on phone: Not on file    Gets together: Not on file    Attends religious service: Not on file    Active member of club or organization: Not on file    Attends meetings of clubs or organizations: Not on file    Relationship status: Not on file  Other Topics Concern  . Not on file  Social History Narrative  . Not on file  Her Allergies Are:  Allergies  Allergen Reactions  . Hydrocodone Other (See Comments)    Pt states made her have a rapid heart beat  :   Her Current Medications Are:  Outpatient Encounter Medications as of 01/06/2019  Medication Sig  . clobetasol cream (TEMOVATE) 7.82 % Apply 1 application topically 2 (two) times daily as needed.  . clonazePAM (KLONOPIN) 0.5 MG tablet Take 1 mg by mouth at bedtime.   Marland Kitchen doxycycline (VIBRAMYCIN) 100 MG capsule Take 100 mg by mouth daily as needed (skin irritation).   . fluticasone (FLONASE) 50 MCG/ACT nasal spray Place 1 spray into both nostrils daily as needed for allergies or rhinitis.  . folic acid (FOLVITE) 1 MG tablet Take 1 mg by mouth daily.  . furosemide (LASIX) 20 MG tablet Take 20 mg by mouth daily.   Marland Kitchen ibuprofen (ADVIL,MOTRIN) 200 MG tablet Take 200 mg by mouth every 8 (eight) hours as needed for mild pain or moderate pain.  . metFORMIN (GLUCOPHAGE-XR) 500 MG 24 hr tablet Take 500 mg by mouth daily.  . methotrexate (RHEUMATREX) 2.5 MG tablet Take 15 mg by mouth 3 (three) times a week. 6 pills every monday   . Multiple Vitamin (MULTIVITAMIN) capsule Take 1 capsule by mouth daily.  . Omeprazole 20 MG TBDD Take 20 mg by mouth daily.   . sertraline (ZOLOFT) 100 MG tablet Take 100 mg by mouth daily.  . [DISCONTINUED] cholecalciferol (VITAMIN D) 1000 units tablet Take 1,000 Units by mouth daily.   No facility-administered  encounter medications on file as of 01/06/2019.   :  Review of Systems:  Out of a complete 14 point review of systems, all are reviewed and negative with the exception of these symptoms as listed below:      Review of Systems  Neurological:       Pt presents today to discuss her cpap. Pt reports that it is going well.    Objective:  Neurological Exam  Physical Exam Physical Examination:   Vitals:   01/06/19 1402  BP: (!) 168/106  Pulse: 90   Repeat blood pressure at the end of the visit was 162/98.  General Examination: The patient is a very pleasant 53 y.o. female in no acute distress. She appears well-developed and well-nourished and well groomed.   HEENT: Normocephalic, atraumatic, pupils are equal, round and reactive to light and accommodation. Extraocular tracking is good without limitation to gaze excursion or nystagmus noted. Normal smooth pursuit is noted. Hearing is grossly intact. Face is symmetric with normal facial animation and normal facial sensation. Speech is clear with no dysarthria noted. There is no hypophonia. There is no lip, neck/head, jaw or voice tremor. Neck is supple with full range of passive and active motion. There are no carotid bruits on auscultation. Oropharynx exam reveals: mild mouth dryness, adequate dental hygiene and moderate airway crowding. Tongue protrudes centrally and palate elevates symmetrically.   Chest: Clear to auscultation without wheezing, rhonchi or crackles noted.  Heart: S1+S2+0, regular and normal without murmurs, rubs or gallops noted.   Abdomen: Soft, non-tender and non-distended with normal bowel sounds appreciated on auscultation.  Extremities: There is trace pitting edema in the distal lower extremities bilaterally. Pedal pulses are intact.  Skin: Warm and dry without trophic changes noted.  Musculoskeletal: exam reveals no obvious joint deformities, tenderness or joint swelling or erythema.   Neurologically:   Mental status: The patient is awake, alert and oriented in all 4 spheres. Her immediate and remote memory, attention,  language skills and fund of knowledge are appropriate. There is no evidence of aphasia, agnosia, apraxia or anomia. Speech is clear with normal prosody and enunciation. Thought process is linear. Mood is normal and affect is normal.  Cranial nerves II - XII are as described above under HEENT exam.  Motor exam: Normal bulk, strength and tone is noted. There is no tremor. Fine motor skills and coordination: grossly intact.  Cerebellar testing: No dysmetria or intention tremor. There is no truncal or gait ataxia.  Sensory exam: intact to light touch.  Gait, station and balance: She stands easily. No veering to one side is noted. No leaning to one side is noted. Posture is age-appropriate and stance is narrow based. Gait shows normal stride length and normal pace. No problems turning are noted.     Assessment and Plan:  In summary, Cheyenne Hamilton is a very pleasant 53 year old female with an underlying medical history of reflux disease, anxiety, depression, psoriasis, smoking, vitamin D deficiency and morbid obesity with BMI of over 67, who Presents for follow-up consultation of her obstructive sleep apnea, on treatment with AutoPap therapy.  Her home sleep study in January 2020 indicated severe sleep apnea. She has been compliant with treatment.  She has Noted improvement in terms of better sleep consolidation and sleep quality.  She is commended for her treatment adherence.  She is motivated to continue.  She has gained Weight in the interim.  She has had increase in stress.  She is willing to work on weight loss.  Her sleep apnea scores are generally Very good, leak is on the low side.  She is advised to follow-up routinely in 1 year.  We can certainly look at her download again in the next month or 2.  She is on the higher end of her maximum pressure of 14. We can certainly tweak the  maximum pressure higher if needed, she is going to keep an eye on her numbers as well.  She is up-to-date with her supplies generally speaking.  She is advised to call us with any interim questions or concerns and hopefully routinely follow-up in 1 year.  I answered all her questions today and she was in agreement.

## 2019-05-04 ENCOUNTER — Telehealth: Payer: Self-pay | Admitting: Neurology

## 2019-05-04 DIAGNOSIS — G4733 Obstructive sleep apnea (adult) (pediatric): Secondary | ICD-10-CM

## 2019-05-04 NOTE — Telephone Encounter (Signed)
I called pt to discuss. No answer, left a message asking her to call me back. I have not received any communication from [facility] regarding pt's supplies. If pt needs a new supply order to get cpap supplies I will be happy to take care of this, please just let me know. Please discuss this with pt if she calls back.

## 2019-05-04 NOTE — Telephone Encounter (Signed)
Pt called needing to speak to RN about her cpap supplies. She states that [facility] has reached out to the office several times. Please advise.

## 2019-05-04 NOTE — Telephone Encounter (Signed)
Order for cpap supplies sent to [facility] via community message.

## 2019-05-05 NOTE — Addendum Note (Signed)
Addended by: Lester Minoa A on: 05/05/2019 01:15 PM   Modules accepted: Orders

## 2019-05-05 NOTE — Telephone Encounter (Signed)
Phone staff took a call from this pt. She has changed her name.  I received this message from [facility]: "we received an order to provide this patient with PAP supplies, however, the order is in her maiden name Cheyenne Hamilton and her insurance is in her married name.Marland KitchenMarland KitchenSpiers, as she is on her husbands Set designer. We will need orders that match her married/insurance name. I checked Epic to see if her name was changed as she told our staff that she contacted your office yesterday about this and I could not bring her up by her married name. Can you get this taken care and let me know when a new Rx is available so that I can move forward?"  I have sent [facility] another order with pt's married name included as requested.

## 2019-10-11 ENCOUNTER — Other Ambulatory Visit: Payer: Self-pay | Admitting: Pulmonary Disease

## 2019-10-11 ENCOUNTER — Other Ambulatory Visit: Payer: Self-pay | Admitting: Podiatry

## 2019-10-11 ENCOUNTER — Other Ambulatory Visit (HOSPITAL_COMMUNITY): Payer: Self-pay | Admitting: Family Medicine

## 2019-10-11 ENCOUNTER — Other Ambulatory Visit: Payer: Self-pay | Admitting: Family Medicine

## 2019-10-11 DIAGNOSIS — M7672 Peroneal tendinitis, left leg: Secondary | ICD-10-CM

## 2019-11-04 ENCOUNTER — Encounter (HOSPITAL_COMMUNITY): Payer: Self-pay

## 2019-11-04 ENCOUNTER — Ambulatory Visit (HOSPITAL_COMMUNITY): Payer: 59

## 2019-12-15 ENCOUNTER — Observation Stay (HOSPITAL_COMMUNITY)
Admission: EM | Admit: 2019-12-15 | Discharge: 2019-12-16 | Disposition: A | Discharge status: Home / Self Care | Payer: Commercial Managed Care - PPO | Attending: Family Medicine | Admitting: Family Medicine

## 2019-12-15 ENCOUNTER — Other Ambulatory Visit: Payer: Self-pay

## 2019-12-15 ENCOUNTER — Emergency Department (HOSPITAL_COMMUNITY): Payer: Commercial Managed Care - PPO

## 2019-12-15 ENCOUNTER — Encounter (HOSPITAL_COMMUNITY): Payer: Self-pay | Admitting: *Deleted

## 2019-12-15 DIAGNOSIS — E119 Type 2 diabetes mellitus without complications: Secondary | ICD-10-CM

## 2019-12-15 DIAGNOSIS — R29818 Other symptoms and signs involving the nervous system: Secondary | ICD-10-CM | POA: Diagnosis present

## 2019-12-15 DIAGNOSIS — R200 Anesthesia of skin: Secondary | ICD-10-CM | POA: Diagnosis present

## 2019-12-15 DIAGNOSIS — F17210 Nicotine dependence, cigarettes, uncomplicated: Secondary | ICD-10-CM | POA: Diagnosis not present

## 2019-12-15 DIAGNOSIS — G459 Transient cerebral ischemic attack, unspecified: Secondary | ICD-10-CM | POA: Diagnosis not present

## 2019-12-15 DIAGNOSIS — Z20822 Contact with and (suspected) exposure to covid-19: Secondary | ICD-10-CM | POA: Insufficient documentation

## 2019-12-15 DIAGNOSIS — Z720 Tobacco use: Secondary | ICD-10-CM

## 2019-12-15 LAB — DIFFERENTIAL
Abs Immature Granulocytes: 0.02 10*3/uL (ref 0.00–0.07)
Basophils Absolute: 0.1 10*3/uL (ref 0.0–0.1)
Basophils Relative: 1 %
Eosinophils Absolute: 0.3 10*3/uL (ref 0.0–0.5)
Eosinophils Relative: 3 %
Immature Granulocytes: 0 %
Lymphocytes Relative: 26 %
Lymphs Abs: 2.7 10*3/uL (ref 0.7–4.0)
Monocytes Absolute: 0.8 10*3/uL (ref 0.1–1.0)
Monocytes Relative: 8 %
Neutro Abs: 6.7 10*3/uL (ref 1.7–7.7)
Neutrophils Relative %: 62 %

## 2019-12-15 LAB — SARS CORONAVIRUS 2 BY RT PCR (HOSPITAL ORDER, PERFORMED IN ~~LOC~~ HOSPITAL LAB): SARS Coronavirus 2: NEGATIVE

## 2019-12-15 LAB — URINALYSIS, ROUTINE W REFLEX MICROSCOPIC
Bilirubin Urine: NEGATIVE
Glucose, UA: NEGATIVE mg/dL
Hgb urine dipstick: NEGATIVE
Ketones, ur: NEGATIVE mg/dL
Leukocytes,Ua: NEGATIVE
Nitrite: NEGATIVE
Protein, ur: NEGATIVE mg/dL
Specific Gravity, Urine: 1.024 (ref 1.005–1.030)
pH: 6 (ref 5.0–8.0)

## 2019-12-15 LAB — RAPID URINE DRUG SCREEN, HOSP PERFORMED
Amphetamines: NOT DETECTED
Barbiturates: NOT DETECTED
Benzodiazepines: NOT DETECTED
Cocaine: NOT DETECTED
Opiates: NOT DETECTED
Tetrahydrocannabinol: NOT DETECTED

## 2019-12-15 LAB — CBC
HCT: 48.3 % — ABNORMAL HIGH (ref 36.0–46.0)
Hemoglobin: 16.3 g/dL — ABNORMAL HIGH (ref 12.0–15.0)
MCH: 32.7 pg (ref 26.0–34.0)
MCHC: 33.7 g/dL (ref 30.0–36.0)
MCV: 97 fL (ref 80.0–100.0)
Platelets: 332 10*3/uL (ref 150–400)
RBC: 4.98 MIL/uL (ref 3.87–5.11)
RDW: 14 % (ref 11.5–15.5)
WBC: 10.6 10*3/uL — ABNORMAL HIGH (ref 4.0–10.5)
nRBC: 0 % (ref 0.0–0.2)

## 2019-12-15 LAB — COMPREHENSIVE METABOLIC PANEL
ALT: 20 U/L (ref 0–44)
AST: 19 U/L (ref 15–41)
Albumin: 4.3 g/dL (ref 3.5–5.0)
Alkaline Phosphatase: 66 U/L (ref 38–126)
Anion gap: 13 (ref 5–15)
BUN: 12 mg/dL (ref 6–20)
CO2: 24 mmol/L (ref 22–32)
Calcium: 9.1 mg/dL (ref 8.9–10.3)
Chloride: 101 mmol/L (ref 98–111)
Creatinine, Ser: 0.84 mg/dL (ref 0.44–1.00)
GFR calc Af Amer: >60 mL/min (ref >60)
GFR calc non Af Amer: >60 mL/min (ref >60)
Glucose, Bld: 140 mg/dL — ABNORMAL HIGH (ref 70–99)
Potassium: 3.7 mmol/L (ref 3.5–5.1)
Sodium: 138 mmol/L (ref 135–145)
Total Bilirubin: 0.4 mg/dL (ref 0.3–1.2)
Total Protein: 7.6 g/dL (ref 6.5–8.1)

## 2019-12-15 LAB — PROTIME-INR
INR: 0.9 (ref 0.8–1.2)
Prothrombin Time: 12.1 seconds (ref 11.4–15.2)

## 2019-12-15 LAB — APTT: aPTT: 27 seconds (ref 24–36)

## 2019-12-15 MED ORDER — ASPIRIN 81 MG PO CHEW
81.0000 mg | CHEWABLE_TABLET | Freq: Once | ORAL | Status: AC
  Administered 2019-12-15: 81 mg via ORAL
  Filled 2019-12-15: qty 1

## 2019-12-15 MED ORDER — ASPIRIN EC 81 MG PO TBEC
81.0000 mg | DELAYED_RELEASE_TABLET | Freq: Every day | ORAL | Status: DC
  Administered 2019-12-16: 81 mg via ORAL
  Filled 2019-12-15: qty 1

## 2019-12-15 MED ORDER — INSULIN ASPART 100 UNIT/ML ~~LOC~~ SOLN: 0.0000 [IU] | Freq: Three times a day (TID) | SUBCUTANEOUS | Status: DC

## 2019-12-15 MED ORDER — INSULIN ASPART 100 UNIT/ML ~~LOC~~ SOLN: 0.0000 [IU] | Freq: Every day | SUBCUTANEOUS | Status: DC

## 2019-12-15 NOTE — ED Notes (Signed)
Pt states to this nurse that she doesn't want to go to [facility] and she would rather stay here if possible. md made aware and will change bed request to stay at [facility].

## 2019-12-15 NOTE — ED Provider Notes (Signed)
[facility] EMERGENCY DEPARTMENT Provider Note   CSN: [id number] Arrival date & time: 12/15/19  1921     History Chief Complaint  Patient presents with  . Numbness    Cheyenne Hamilton is a 54 y.o. female.  She has a history of acid reflux anxiety migraines.  She said in May she had 2 episodes of hand and face numbness that each lasted about 5 to 10 minutes.  1 was on the right side and one was on the left side.  She had a similar event again today with right hand numbness that moved up her arm towards her axilla along with numbness on the inner part of her mouth that extended out onto her cheek.  That lasted about 5 to 10 minutes and resolved.  She still has a low-grade headache that she frequently gets and she is noticing some shimmering in her vision on both sides.  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  She also thinks her right hand was weak when the episode occurred.  No double vision no difficulty speaking no balance issues.  The history is provided by the patient.  Cerebrovascular Accident This is a new problem. The current episode started 6 to 12 hours ago. The problem occurs rarely. The problem has been resolved. Associated symptoms include headaches. Pertinent negatives include no chest pain, no abdominal pain and no shortness of breath. Nothing aggravates the symptoms. Nothing relieves the symptoms. She has tried nothing for the symptoms. The treatment provided no relief.       Past Medical History:  Diagnosis Date  . Acid reflux   . Anxiety   . Complication of anesthesia    Hard to wake up    Patient Active Problem List   Diagnosis Date Noted  . Diarrhea 03/03/2017  . Taking multiple medications for chronic disease 03/03/2017  . PFS (patellofemoral syndrome) 03/30/2012    Past Surgical History:  Procedure Laterality Date  . BIOPSY  04/18/2017   Procedure: BIOPSY;  Surgeon: Daneil Dolin, MD;  Location: [facility];  Service: Endoscopy;;   ascending/descending/sigmoid  . CERVICAL CERCLAGE    . CHOLECYSTECTOMY    . COLONOSCOPY WITH PROPOFOL N/A 04/18/2017   Procedure: COLONOSCOPY WITH PROPOFOL;  Surgeon: Daneil Dolin, MD;  Location: [facility];  Service: Endoscopy;  Laterality: N/A;  7:30am  . POLYPECTOMY  04/18/2017   Procedure: POLYPECTOMY;  Surgeon: Daneil Dolin, MD;  Location: [facility];  Service: Endoscopy;;  transverse colon     OB History   No obstetric history on file.     Family History  Problem Relation Age of Onset  . Arthritis Other     Social History   Tobacco Use  . Smoking status: Current Every Day Smoker    Packs/day: 0.50  . Smokeless tobacco: Never Used  . Tobacco comment: smoked last at 6:00am  Substance Use Topics  . Alcohol use: No  . Drug use: No    Home Medications Prior to Admission medications   Medication Sig Start Date End Date Taking? Authorizing Provider  clobetasol cream (TEMOVATE) 9.56 % Apply 1 application topically 2 (two) times daily as needed.    [provider]  clonazePAM (KLONOPIN) 0.5 MG tablet Take 1 mg by mouth at bedtime.     [provider]  doxycycline (VIBRAMYCIN) 100 MG capsule Take 100 mg by mouth daily as needed (skin irritation).     [provider]  fluticasone (FLONASE) 50 MCG/ACT nasal spray  Place 1 spray into both nostrils daily as needed for allergies or rhinitis.    [provider]  folic acid (FOLVITE) 1 MG tablet Take 1 mg by mouth daily.    [provider]  furosemide (LASIX) 20 MG tablet Take 20 mg by mouth daily.     [provider]  ibuprofen (ADVIL,MOTRIN) 200 MG tablet Take 200 mg by mouth every 8 (eight) hours as needed for mild pain or moderate pain.    [provider]  metFORMIN (GLUCOPHAGE-XR) 500 MG 24 hr tablet Take 500 mg by mouth daily. 12/29/18   [provider]  methotrexate (RHEUMATREX) 2.5 MG tablet Take 15 mg by mouth 3 (three) times a week. 6 pills every  monday     [provider]  Multiple Vitamin (MULTIVITAMIN) capsule Take 1 capsule by mouth daily.    [provider]  Omeprazole 20 MG TBDD Take 20 mg by mouth daily.     [provider]  sertraline (ZOLOFT) 100 MG tablet Take 100 mg by mouth daily.    [provider]    Allergies    Hydrocodone  Review of Systems   Review of Systems  Constitutional: Negative for fever.  HENT: Negative for sore throat.   Eyes: Positive for visual disturbance.  Respiratory: Negative for shortness of breath.   Cardiovascular: Negative for chest pain.  Gastrointestinal: Negative for abdominal pain.  Genitourinary: Negative for dysuria.  Musculoskeletal: Negative for neck pain.  Skin: Negative for rash.  Neurological: Positive for weakness, numbness and headaches. Negative for speech difficulty.    Physical Exam Updated Vital Signs BP 135/87 (BP Location: Right Arm)   Pulse 95   Temp (!) 97.3 F (36.3 C) (Oral)   Resp 18   Ht 5\' 4"  (1.626 m)   Wt 133.8 kg   SpO2 95%   BMI 50.64 kg/m   Physical Exam Vitals and nursing note reviewed.  Constitutional:      General: She is not in acute distress.    Appearance: Normal appearance. She is well-developed.  HENT:     Head: Normocephalic and atraumatic.  Eyes:     Extraocular Movements: Extraocular movements intact.     Conjunctiva/sclera: Conjunctivae normal.     Pupils: Pupils are equal, round, and reactive to light.  Cardiovascular:     Rate and Rhythm: Normal rate and regular rhythm.     Heart sounds: No murmur heard.   Pulmonary:     Effort: Pulmonary effort is normal. No respiratory distress.     Breath sounds: Normal breath sounds.  Abdominal:     Palpations: Abdomen is soft.     Tenderness: There is no abdominal tenderness.  Musculoskeletal:        General: No deformity or signs of injury. Normal range of motion.     Cervical back: Neck supple.  Skin:    General: Skin is warm and dry.      Capillary Refill: Capillary refill takes less than 2 seconds.  Neurological:     General: No focal deficit present.     Mental Status: She is alert and oriented to person, place, and time.     Cranial Nerves: No cranial nerve deficit.     Sensory: No sensory deficit.     Motor: No weakness.     Coordination: Coordination normal.     Gait: Gait normal.     ED Results / Procedures / Treatments   Labs (all labs ordered are listed, but  only abnormal results are displayed) Labs Reviewed  CBC - Abnormal; Notable for the following components:      Result Value   WBC 10.6 (*)    Hemoglobin 16.3 (*)    HCT 48.3 (*)    All other components within normal limits  COMPREHENSIVE METABOLIC PANEL - Abnormal; Notable for the following components:   Glucose, Bld 140 (*)    All other components within normal limits  HEMOGLOBIN A1C - Abnormal; Notable for the following components:   Hgb A1c MFr Bld 6.3 (*)    All other components within normal limits  GLUCOSE, CAPILLARY - Abnormal; Notable for the following components:   Glucose-Capillary 167 (*)    All other components within normal limits  HEMOGLOBIN A1C - Abnormal; Notable for the following components:   Hgb A1c MFr Bld 6.4 (*)    All other components within normal limits  LIPID PANEL - Abnormal; Notable for the following components:   Triglycerides 258 (*)    HDL 31 (*)    VLDL 52 (*)    All other components within normal limits  GLUCOSE, CAPILLARY - Abnormal; Notable for the following components:   Glucose-Capillary 110 (*)    All other components within normal limits  SARS CORONAVIRUS 2 BY RT PCR (HOSPITAL ORDER, [facility])  PROTIME-INR  APTT  DIFFERENTIAL  RAPID URINE DRUG SCREEN, HOSP PERFORMED  URINALYSIS, ROUTINE W REFLEX MICROSCOPIC  HIV ANTIBODY (ROUTINE TESTING W REFLEX)    EKG EKG Interpretation  Date/Time:  Wednesday December 15 2019 20:47:21 EDT Ventricular Rate:  101 PR Interval:    QRS  Duration: 86 QT Interval:  339 QTC Calculation: 440 R Axis:   69 Text Interpretation: Sinus tachycardia Low voltage, precordial leads Borderline repolarization abnormality Baseline wander in lead(s) V1 V6 No old tracing to compare Confirmed by Aletta Edouard [phone]) on 12/15/2019 9:07:15 PM   Radiology CT HEAD WO CONTRAST  Result Date: 12/15/2019 CLINICAL DATA:  Intermittent upper extremity numbness, alternating right and left EXAM: CT HEAD WITHOUT CONTRAST TECHNIQUE: Contiguous axial images were obtained from the base of the skull through the vertex without intravenous contrast. COMPARISON:  None. FINDINGS: Brain: No evidence of acute infarction, hemorrhage, hydrocephalus, extra-axial collection or mass lesion/mass effect. Vascular: No hyperdense vessel or unexpected calcification. Skull: No calvarial fracture or suspicious osseous lesion. No scalp swelling or hematoma. Sinuses/Orbits: Paranasal sinuses and mastoid air cells are predominantly clear. Included orbital structures are unremarkable. Other: None IMPRESSION: No acute intracranial abnormality. Electronically Signed   By: Lovena Le M.D.   On: 12/15/2019 20:33   MR ANGIO HEAD WO CONTRAST  Result Date: 12/16/2019 CLINICAL DATA:  Right-sided weakness for 2 days EXAM: MRA HEAD WITHOUT CONTRAST TECHNIQUE: Angiographic images of the Circle of Willis were obtained using MRA technique without intravenous contrast. COMPARISON:  None. FINDINGS: Unremarkable intracranial anatomy. Vessels are smooth and widely patent. No branch occlusion, beading, or aneurysm. IMPRESSION: Negative intracranial MRA. Electronically Signed   By: Monte Fantasia M.D.   On: 12/16/2019 09:05   MR ANGIO NECK W WO CONTRAST  Result Date: 12/16/2019 CLINICAL DATA:  Right-sided weakness for 2 days EXAM: MRA NECK WITHOUT AND WITH CONTRAST TECHNIQUE: Multiplanar and multiecho pulse sequences of the neck were obtained without and with intravenous contrast. Angiographic images of the  neck were obtained using MRA technique without and with intravenous contrast. CONTRAST:  69mL GADAVIST GADOBUTROL 1 MMOL/ML IV SOLN COMPARISON:  None. FINDINGS: Antegrade flow in the carotid and vertebral  arteries by time-of-flight. No gross abnormality seen on the mask. Normal diameter arch with 3 vessel branching. There is motion artifact at the level of the great vessels and especially the proximal vertebral and common carotid vessels. No flow limiting stenosis is seen in areas not affected by artifact. No beading or visible ulceration. IMPRESSION: Motion artifact limits visualization in the lower neck and upper chest, especially affecting common carotid and vertebral assessment. No acute finding or evidence of stenosis. Electronically Signed   By: Monte Fantasia M.D.   On: 12/16/2019 09:00   MR BRAIN W WO CONTRAST  Result Date: 12/16/2019 CLINICAL DATA:  Right-sided weakness for 2 days EXAM: MRI HEAD WITHOUT AND WITH CONTRAST TECHNIQUE: Multiplanar, multiecho pulse sequences of the brain and surrounding structures were obtained without and with intravenous contrast. CONTRAST:  39mL GADAVIST GADOBUTROL 1 MMOL/ML IV SOLN COMPARISON:  None. FINDINGS: Brain: No acute infarction, hemorrhage, hydrocephalus, extra-axial collection or mass lesion. Normal brain volume. Scattered small FLAIR hyperintensities in the cerebral white matter without specific pattern. In the setting of diabetes and smoking this is most likely chronic small vessel ischemia. Dilated perivascular spaces below the basal ganglia and in the left midbrain. Vascular: Normal flow voids and vascular enhancements. Skull and upper cervical spine: Normal marrow signal Sinuses/Orbits: Negative IMPRESSION: 1. No acute finding or specific explanation for weakness. 2. Mild white matter disease without specific pattern, medical history suggesting chronic small vessel ischemia. Electronically Signed   By: Monte Fantasia M.D.   On: 12/16/2019 09:04    ECHOCARDIOGRAM COMPLETE  Result Date: 12/16/2019    ECHOCARDIOGRAM REPORT   Patient Name:   REIGHLYNN SWINEY Date of Exam: 12/16/2019 Medical Rec #:  [id number]        Height:       64.0 in Accession #:    [id number]       Weight:       291.4 lb Date of Birth:  09-Sep-1965        BSA:          2.296 m Patient Age:    43 years         BP:           117/65 mmHg Patient Gender: F                HR:           90 bpm. Exam Location:  [facility] Procedure: 2D Echo Indications:    TIA 435.9 / G45.9  History:        Patient has no prior history of Echocardiogram examinations.                 Risk Factors:Diabetes and Current Smoker. Acid reflux, ocular                 migraine, tingling and numbness, she does report right hand                 numbness.  Sonographer:    Leavy Cella RDCS (AE) Referring Phys: [id number] DAWOOD S ELGERGAWY IMPRESSIONS  1. Left ventricular ejection fraction, by estimation, is 55 to 60%. The left ventricle has normal function. The left ventricle has no regional wall motion abnormalities. Left ventricular diastolic parameters were normal.  2. Right ventricular systolic function is normal. The right ventricular size is normal. Tricuspid regurgitation signal is inadequate for assessing PA pressure.  3. The mitral valve is grossly normal. Trivial mitral valve regurgitation.  4. The aortic valve  is tricuspid. Aortic valve regurgitation is not visualized.  5. The inferior vena cava is normal in size with greater than 50% respiratory variability, suggesting right atrial pressure of 3 mmHg. FINDINGS  Left Ventricle: Left ventricular ejection fraction, by estimation, is 55 to 60%. The left ventricle has normal function. The left ventricle has no regional wall motion abnormalities. The left ventricular internal cavity size was normal in size. There is  no left ventricular hypertrophy. Left ventricular diastolic parameters were normal. Right Ventricle: The right ventricular size is normal. No increase in  right ventricular wall thickness. Right ventricular systolic function is normal. Tricuspid regurgitation signal is inadequate for assessing PA pressure. Left Atrium: Left atrial size was normal in size. Right Atrium: Right atrial size was normal in size. Pericardium: There is no evidence of pericardial effusion. Presence of pericardial fat pad. Mitral Valve: The mitral valve is grossly normal. Trivial mitral valve regurgitation. Tricuspid Valve: The tricuspid valve is grossly normal. Tricuspid valve regurgitation is trivial. Aortic Valve: The aortic valve is tricuspid. Aortic valve regurgitation is not visualized. Pulmonic Valve: The pulmonic valve was grossly normal. Pulmonic valve regurgitation is trivial. Aorta: The aortic root is normal in size and structure. Venous: The inferior vena cava is normal in size with greater than 50% respiratory variability, suggesting right atrial pressure of 3 mmHg. IAS/Shunts: No atrial level shunt detected by color flow Doppler. Rozann Lesches MD Electronically signed by Rozann Lesches MD Signature Date/Time: 12/16/2019/10:35:08 AM    Final     Procedures Procedures (including critical care time)  Medications Ordered in ED Medications  SUMAtriptan (IMITREX) tablet 50 mg (has no administration in time range)  rosuvastatin (CRESTOR) tablet 5 mg (5 mg Oral Given 12/16/19 1028)  escitalopram (LEXAPRO) tablet 10 mg (10 mg Oral Given 12/16/19 1027)  pantoprazole (PROTONIX) EC tablet 40 mg (40 mg Oral Given 10/10/82 1324)  folic acid (FOLVITE) tablet 1 mg (1 mg Oral Given 12/16/19 1028)  clonazePAM (KLONOPIN) tablet 1 mg (has no administration in time range)  fluticasone (FLONASE) 50 MCG/ACT nasal spray 1 spray (has no administration in time range)  acetaminophen (TYLENOL) tablet 650 mg (has no administration in time range)    Or  acetaminophen (TYLENOL) 160 MG/5ML solution 650 mg (has no administration in time range)    Or  acetaminophen (TYLENOL) suppository 650 mg (has no  administration in time range)  senna-docusate (Senokot-S) tablet 1 tablet (has no administration in time range)  heparin injection 5,000 Units (5,000 Units Subcutaneous Given 12/16/19 0630)  insulin aspart (novoLOG) injection 0-15 Units (0 Units Subcutaneous Not Given 12/16/19 0856)  insulin aspart (novoLOG) injection 0-5 Units (0 Units Subcutaneous Not Given 12/15/19 2350)  aspirin EC tablet 81 mg (81 mg Oral Given 12/16/19 1029)  multivitamin with minerals tablet 1 tablet (1 tablet Oral Given 12/16/19 1029)  aspirin chewable tablet 81 mg (81 mg Oral Given 12/15/19 2210)   stroke: mapping our early stages of recovery book ( Does not apply Given 12/16/19 0400)  gadobutrol (GADAVIST) 1 MMOL/ML injection 10 mL (10 mLs Intravenous Contrast Given 12/16/19 0739)    ED Course  I have reviewed the triage vital signs and the nursing notes.  Pertinent labs & imaging results that were available during my care of the patient were reviewed by me and considered in my medical decision making (see chart for details).  Clinical Course as of Dec 15 1100  Wed Dec 15, 2019  2042 Consulted teleneurology and they are going to evaluate the patient.   [  MB]  2042 Discussed with teleneurology, they are recommending that she get admitted for an MRI/MRA.  Patient is agreeable to plan.  Hospitalist has been paged.   [MB]    Clinical Course User Index [MB] Hayden Rasmussen, MD   MDM Rules/Calculators/A&P                         This patient complains of multiple episodes of paresthesias possible weakness; this involves an extensive number of treatment Options and is a complaint that carries with it a high risk of complications and Morbidity. The differential includes stroke, TIA, embolic phenomenon, arrhythmia, metabolic derangement  I ordered, reviewed and interpreted labs, which included CBC with mildly elevated white count and hemoglobin, chemistries normal other than elevated glucose, tox and urinalysis negative  I  ordered medication aspirin I ordered imaging studies which included CT head and I independently    visualized and interpreted imaging which showed no acute findings Previous records obtained and reviewed in epic I consulted teleneurology and [facility] hospitalist Dr. Waldron [facility] and discussed lab and imaging findings  Critical Interventions: None  After the interventions stated above, I reevaluated the patient and found patient to be neuro intact and asymptomatic. Reviewed neurology recommendations with her and she is agreeable to admission.   Final Clinical Impression(s) / ED Diagnoses Final diagnoses:  Numbness  TIA (transient ischemic attack)    Rx / DC Orders ED Discharge Orders    None       Hayden Rasmussen, MD 12/16/19 1105

## 2019-12-15 NOTE — ED Triage Notes (Signed)
Pt denies any numbness at this time.  Pt c/o HA earlier and has a slight HA at present, pt states her vision is different.

## 2019-12-15 NOTE — Consult Note (Signed)
[facility] Tele[facility]rology Consult Services  Stat Consult  Date of Service:   12/15/2019 20:36:58  Impression:     .  R20.2 - Paresthesia of skin  Comments/Sign-Out: Patient with transient numbness of face and hand. Most commonly on right but has been on the left and episodes are increasing in frequency therefore recommend work up to rule out TIA/stroke, demyelinating process or malignancy.  CT HEAD: Showed No Acute Hemorrhage or Acute Core Infarct  Metrics: [facility] Notification Time: 12/15/2019 20:36:00 Stamp Time: 12/15/2019 20:36:58 Callback Response Time: 12/15/2019 20:38:52  Our recommendations are outlined below.  Recommendations:     .  Initiate Aspirin 81 MG Daily  Imaging Studies:     .  MRI Head with and Without Contrast     .  MRA Head and Neck Without Contrast When Available - Stroke Protocol     .  Echocardiogram - Transthoracic Echocardiogram  Disposition: Neurology Follow Up Recommended  Sign Out:     .  Discussed with Emergency Department Provider  ----------------------------------------------------------------------------------------------------  Chief Complaint: right sided numbness and weakness  History of Present Illness: Patient is a 54 year old Female.  54 yo F with history of ocular migraines and DM who is presenting with episodes of right hand numbness. Patient states that this is her thiird episode. Her right hand went numb from her wrist and right side of her mouth and face. That was how the first episode was and then the second was the same but on the left. This episode is now on the right again but this time it traveled up to her shoulder as well. She got a headache after. The symptoms last for about 5 minutes. This headache is similar to his ocular migraines. She had similar symptoms with her ocular migraines. Those have been going on for a long time but she has not gotten a work up for them neurologically only her  eyes checked.       Examination: BP(135/87), Pulse(95), Blood Glucose(140) 1A: Level of Consciousness - Alert; keenly responsive + 0 1B: Ask Month and Age - Both Questions Right + 0 1C: Blink Eyes & Squeeze Hands - Performs Both Tasks + 0 2: Test Horizontal Extraocular Movements - Normal + 0 3: Test Visual Fields - No Visual Loss + 0 4: Test Facial Palsy (Use Grimace if Obtunded) - Normal symmetry + 0 5A: Test Left Arm Motor Drift - No Drift for 10 Seconds + 0 5B: Test Right Arm Motor Drift - No Drift for 10 Seconds + 0 6A: Test Left Leg Motor Drift - No Drift for 5 Seconds + 0 6B: Test Right Leg Motor Drift - No Drift for 5 Seconds + 0 7: Test Limb Ataxia (FNF/Heel-Shin) - No Ataxia + 0 8: Test Sensation - Normal; No sensory loss + 0 9: Test Language/Aphasia - Normal; No aphasia + 0 10: Test Dysarthria - Normal + 0 11: Test Extinction/Inattention - No abnormality + 0  NIHSS Score: 0   Patient/Family was informed the Neurology Consult would occur via TeleHealth consult by way of interactive audio and video telecommunications and consented to receiving care in this manner.  Patient is being evaluated for possible acute neurologic impairment and high probability of imminent or life-threatening deterioration. I spent total of 30 minutes providing care to this patient, including time for face to face visit via telemedicine, review of medical records, imaging studies and discussion of findings with providers, the patient and/or family.   Dr Carolin Sicks  [facility] [phone]  Case [id number]

## 2019-12-15 NOTE — ED Notes (Signed)
Pt

## 2019-12-15 NOTE — ED Triage Notes (Signed)
Pt with right hand and mouth numbness on May 14, went away on it's own within 5-7 min and happened again on May 18 but on left side.  Pt seen PCP after second time.  Pt states occurred today at 0900 and on right side, pt messaged her PCP.  Pt decided to come here for an evaluation.

## 2019-12-15 NOTE — H&P (Signed)
[facility] H&P   Patient Demographics:    Cheyenne Hamilton, is a 54 y.o. female  MRN: [id number]   DOB - 03/30/1966  Admit Date - 12/15/2019  Outpatient Primary MD for the patient is Denny Levy, [place]  Referring MD/NP/PA: Dr Melina Copa  Patient coming from: Home  Chief Complaint  Patient presents with  . Numbness      HPI:    Cheyenne Hamilton  is a 54 y.o. female, story of OSA on CPAP, diabetes mellitus on Metformin, ocular migraine, psoriasis, patient presents to ED secondary to complaints of tingling and numbness, she does report right hand numbness, with radiation to forearm area, and right side of the mouth face and tongue, this has been alternating, she had 2 episodes in May, one of them was on the left side, and the other one on the right side, with similar presentation, they last for for 5 minutes, followed by headache, the headache is similar to her ocular migraine, clear migraine has been going on for long time, she denies any other focal deficits, weakness, slurred speech, altered mental status or loss of consciousness. - in ED no significant labs abnormalities beside hemoglobin of 16.3, EKG nonacute, CT head nonacute, patient was seen by teleneurology who recommended admission for TIA/CVA, versus demyelinating disorder work-up.    Review of systems:    In addition to the HPI above,  No Fever-chills, No Headache, No changes with Vision or hearing, No problems swallowing food or Liquids, No Chest pain, Cough or Shortness of Breath, No Abdominal pain, No Nausea or Vommitting, Bowel movements are regular, No Blood in stool or Urine, No dysuria, No new skin rashes or bruises, No new joints pains-aches,  Reports tingling and numbness. No recent weight gain or loss, No polyuria, polydypsia or polyphagia, No significant Mental Stressors.  A full 10 point Review of Systems was done,  except as stated above, all other Review of Systems were negative.   With Past History of the following :    Past Medical History:  Diagnosis Date  . Acid reflux   . Anxiety   . Complication of anesthesia    Hard to wake up      Past Surgical History:  Procedure Laterality Date  . BIOPSY  04/18/2017   Procedure: BIOPSY;  Surgeon: Daneil Dolin, MD;  Location: [facility];  Service: Endoscopy;;  ascending/descending/sigmoid  . CERVICAL CERCLAGE    . CHOLECYSTECTOMY    . COLONOSCOPY WITH PROPOFOL N/A 04/18/2017   Procedure: COLONOSCOPY WITH PROPOFOL;  Surgeon: Daneil Dolin, MD;  Location: [facility];  Service: Endoscopy;  Laterality: N/A;  7:30am  . POLYPECTOMY  04/18/2017   Procedure: POLYPECTOMY;  Surgeon: Daneil Dolin, MD;  Location: [facility];  Service: Endoscopy;;  transverse colon      Social History:     Social History   Tobacco Use  .  Smoking status: Current Every Day Smoker    Packs/day: 0.50  . Smokeless tobacco: Never Used  . Tobacco comment: smoked last at 6:00am  Substance Use Topics  . Alcohol use: No       Family History :     Family History  Problem Relation Age of Onset  . Arthritis Other       Home Medications:   Prior to Admission medications   Medication Sig Start Date End Date Taking? Authorizing Provider  clobetasol cream (TEMOVATE) 8.41 % Apply 1 application topically 2 (two) times daily as needed.    [provider]  clonazePAM (KLONOPIN) 0.5 MG tablet Take 1 mg by mouth at bedtime.     [provider]  doxycycline (VIBRAMYCIN) 100 MG capsule Take 100 mg by mouth daily as needed (skin irritation).     [provider]  escitalopram (LEXAPRO) 10 MG tablet Take 10 mg by mouth daily. 12/04/19   [provider]  fluticasone (FLONASE) 50 MCG/ACT nasal spray Place 1 spray into both nostrils daily as needed for allergies or rhinitis.    [provider]  folic acid (FOLVITE) 1 MG tablet  Take 1 mg by mouth daily.    [provider]  furosemide (LASIX) 20 MG tablet Take 20 mg by mouth daily.     [provider]  ibuprofen (ADVIL,MOTRIN) 200 MG tablet Take 200 mg by mouth every 8 (eight) hours as needed for mild pain or moderate pain.    [provider]  metFORMIN (GLUCOPHAGE-XR) 500 MG 24 hr tablet Take 500 mg by mouth daily. 12/29/18   [provider]  methotrexate (RHEUMATREX) 2.5 MG tablet Take 15 mg by mouth 3 (three) times a week. 6 pills every monday     [provider]  Multiple Vitamin (MULTIVITAMIN) capsule Take 1 capsule by mouth daily.    [provider]  omeprazole (PRILOSEC) 20 MG capsule Take 20 mg by mouth daily. 12/04/19   [provider]  rosuvastatin (CRESTOR) 5 MG tablet SMARTSIG:1 Tablet(s) By Mouth Every Evening 12/04/19   [provider]  sertraline (ZOLOFT) 100 MG tablet Take 100 mg by mouth daily.    [provider]  SUMAtriptan (IMITREX) 50 MG tablet Take 50 mg by mouth as directed. 11/04/19   [provider]     Allergies:     Allergies  Allergen Reactions  . Hydrocodone Other (See Comments)    Pt states made her have a rapid heart beat     Physical Exam:   Vitals  Blood pressure 134/86, pulse 84, temperature (!) 97.3 F (36.3 C), temperature source Oral, resp. rate 16, height 5\' 4"  (1.626 m), weight 133.8 kg, SpO2 97 %.   1. General well developed female, sitting in bed, in no apparent distress  2. Normal affect and insight, Not Suicidal or Homicidal, Awake Alert, Oriented X 3.  3. No F.N deficits, ALL C.Nerves Intact, Strength 5/5 all 4 extremities, Sensation intact all 4 extremities, Plantars down going.  4. Ears and Eyes appear Normal, Conjunctivae clear, PERRLA. Moist Oral Mucosa.  5. Supple Neck, No JVD, No cervical lymphadenopathy appriciated, No Carotid Bruits.  6. Symmetrical Chest wall movement, Good air movement bilaterally, CTAB.  7. RRR,  No Gallops, Rubs or Murmurs, No Parasternal Heave.  8. Positive Bowel Sounds, Abdomen Soft, No tenderness, No organomegaly appriciated,No rebound -guarding or rigidity.  9.  No Cyanosis, Normal Skin Turgor, No Skin Rash or Bruise.  10. Good muscle tone,  joints appear normal , no effusions, Normal ROM.       Data Review:    CBC Recent Labs  Lab 12/15/19 2036  WBC 10.6*  HGB 16.3*  HCT 48.3*  PLT 332  MCV 97.0  MCH 32.7  MCHC 33.7  RDW 14.0  LYMPHSABS 2.7  MONOABS 0.8  EOSABS 0.3  BASOSABS 0.1   ------------------------------------------------------------------------------------------------------------------  Chemistries  Recent Labs  Lab 12/15/19 2036  NA 138  K 3.7  CL 101  CO2 24  GLUCOSE 140*  BUN 12  CREATININE 0.84  CALCIUM 9.1  AST 19  ALT 20  ALKPHOS 66  BILITOT 0.4   ------------------------------------------------------------------------------------------------------------------ estimated creatinine clearance is 104.3 mL/min (by C-G formula based on SCr of 0.84 mg/dL). ------------------------------------------------------------------------------------------------------------------ No results for input(s): TSH, T4TOTAL, T3FREE, THYROIDAB in the last 72 hours.  Invalid input(s): FREET3  Coagulation profile Recent Labs  Lab 12/15/19 2036  INR 0.9   ------------------------------------------------------------------------------------------------------------------- No results for input(s): DDIMER in the last 72 hours. -------------------------------------------------------------------------------------------------------------------  Cardiac Enzymes No results for input(s): CKMB, TROPONINI, MYOGLOBIN in the last 168 hours.  Invalid input(s): CK ------------------------------------------------------------------------------------------------------------------ No results found for:  BNP   ---------------------------------------------------------------------------------------------------------------  Urinalysis    Component Value Date/Time   COLORURINE YELLOW 12/15/2019 2012   APPEARANCEUR CLEAR 12/15/2019 2012   LABSPEC 1.024 12/15/2019 2012   PHURINE 6.0 12/15/2019 2012   GLUCOSEU NEGATIVE 12/15/2019 2012   HGBUR NEGATIVE 12/15/2019 2012   [place] NEGATIVE 12/15/2019 2012   [place] NEGATIVE 12/15/2019 2012   PROTEINUR NEGATIVE 12/15/2019 2012   NITRITE NEGATIVE 12/15/2019 2012   LEUKOCYTESUR NEGATIVE 12/15/2019 2012    ----------------------------------------------------------------------------------------------------------------   Imaging Results:    CT HEAD WO CONTRAST  Result Date: 12/15/2019 CLINICAL DATA:  Intermittent upper extremity numbness, alternating right and left EXAM: CT HEAD WITHOUT CONTRAST TECHNIQUE: Contiguous axial images were obtained from the base of the skull through the vertex without intravenous contrast. COMPARISON:  None. FINDINGS: Brain: No evidence of acute infarction, hemorrhage, hydrocephalus, extra-axial collection or mass lesion/mass effect. Vascular: No hyperdense vessel or unexpected calcification. Skull: No calvarial fracture or suspicious osseous lesion. No scalp swelling or hematoma. Sinuses/Orbits: Paranasal sinuses and mastoid air cells are predominantly clear. Included orbital structures are unremarkable. Other: None IMPRESSION: No acute intracranial abnormality. Electronically Signed   By: Lovena Le M.D.   On: 12/15/2019 20:33    My personal review of EKG: Rhythm NSR, Rate  101 /min, QTc 440 , no Acute ST changes   Assessment & Plan:    Active Problems:   Focal neurological deficit   Type 2 diabetes mellitus without complication (HCC)   Tobacco abuse   Focal neurological deficits: -Patient presents with transient tingling numbness, in the face and hand, alternating between left and right side, with  increased frequency. -Neurology consult greatly appreciated, she will need work-up to rule out TIA/stroke, demyelinating process or malignancy. -Will obtain MRI brain with without contrast, 2D echo, continue to monitor on telemetry, MRA head and neck, will continue to monitor on telemetry. -Will start on aspirin 81 mg oral daily  Type 2 diabetes mellitus -Will check A1c, hold Metformin, keep on insulin sliding scale  History of psoriasis -Will hold methotrexate.  Morbid obesity -With BMI of 50.64  Tobacco abuse - she was counseled  OSA -On CPAP.  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  Code Status Full  Likely DC to Home  Condition GUARDED    Consults called: seen By Tele Neuro in ED   Admission status: Observation  Time spent in minutes : 50 minutes   Phillips Climes M.D on 12/15/2019 at 10:27 PM   [facility] - Office  [phone]

## 2019-12-16 ENCOUNTER — Observation Stay (HOSPITAL_BASED_OUTPATIENT_CLINIC_OR_DEPARTMENT_OTHER): Payer: Commercial Managed Care - PPO

## 2019-12-16 ENCOUNTER — Observation Stay (HOSPITAL_COMMUNITY): Payer: Commercial Managed Care - PPO

## 2019-12-16 DIAGNOSIS — G459 Transient cerebral ischemic attack, unspecified: Secondary | ICD-10-CM | POA: Diagnosis not present

## 2019-12-16 DIAGNOSIS — F17210 Nicotine dependence, cigarettes, uncomplicated: Secondary | ICD-10-CM | POA: Diagnosis not present

## 2019-12-16 DIAGNOSIS — E119 Type 2 diabetes mellitus without complications: Secondary | ICD-10-CM | POA: Diagnosis not present

## 2019-12-16 DIAGNOSIS — Z20822 Contact with and (suspected) exposure to covid-19: Secondary | ICD-10-CM | POA: Diagnosis not present

## 2019-12-16 DIAGNOSIS — R29818 Other symptoms and signs involving the nervous system: Secondary | ICD-10-CM | POA: Diagnosis not present

## 2019-12-16 DIAGNOSIS — Z720 Tobacco use: Secondary | ICD-10-CM | POA: Diagnosis not present

## 2019-12-16 LAB — HIV ANTIBODY (ROUTINE TESTING W REFLEX): HIV Screen 4th Generation wRfx: NONREACTIVE

## 2019-12-16 LAB — ECHOCARDIOGRAM COMPLETE
Height: 64 in
Weight: 4663.17 oz

## 2019-12-16 LAB — HEMOGLOBIN A1C
Hgb A1c MFr Bld: 6.3 % — ABNORMAL HIGH (ref 4.8–5.6)
Hgb A1c MFr Bld: 6.4 % — ABNORMAL HIGH (ref 4.8–5.6)
Mean Plasma Glucose: 134.11 mg/dL
Mean Plasma Glucose: 136.98 mg/dL

## 2019-12-16 LAB — GLUCOSE, CAPILLARY
Glucose-Capillary: 167 mg/dL — ABNORMAL HIGH (ref 70–99)
Glucose-Capillary: 110 mg/dL — ABNORMAL HIGH (ref 70–99)
Glucose-Capillary: 176 mg/dL — ABNORMAL HIGH (ref 70–99)

## 2019-12-16 LAB — LIPID PANEL
Cholesterol: 132 mg/dL (ref 0–200)
HDL: 31 mg/dL — ABNORMAL LOW (ref >40)
LDL Cholesterol: 49 mg/dL (ref 0–99)
Total CHOL/HDL Ratio: 4.3 RATIO
Triglycerides: 258 mg/dL — ABNORMAL HIGH (ref <150)
VLDL: 52 mg/dL — ABNORMAL HIGH (ref 0–40)

## 2019-12-16 MED ORDER — SUMATRIPTAN SUCCINATE 50 MG PO TABS: 50.0000 mg | ORAL_TABLET | ORAL | Status: DC | PRN

## 2019-12-16 MED ORDER — CLONAZEPAM 0.5 MG PO TABS: 1.0000 mg | ORAL_TABLET | Freq: Every day | ORAL | Status: DC

## 2019-12-16 MED ORDER — ROSUVASTATIN CALCIUM 10 MG PO TABS
5.0000 mg | ORAL_TABLET | Freq: Every day | ORAL | Status: DC
  Administered 2019-12-16: 5 mg via ORAL
  Filled 2019-12-16: qty 1

## 2019-12-16 MED ORDER — GADOBUTROL 1 MMOL/ML IV SOLN
10.0000 mL | Freq: Once | INTRAVENOUS | Status: AC | PRN
  Administered 2019-12-16: 10 mL via INTRAVENOUS

## 2019-12-16 MED ORDER — ACETAMINOPHEN 650 MG RE SUPP: 650.0000 mg | RECTAL | Status: DC | PRN

## 2019-12-16 MED ORDER — ACETAMINOPHEN 160 MG/5ML PO SOLN: 650.0000 mg | ORAL | Status: DC | PRN

## 2019-12-16 MED ORDER — PANTOPRAZOLE SODIUM 40 MG PO TBEC
40.0000 mg | DELAYED_RELEASE_TABLET | Freq: Every day | ORAL | Status: DC
  Administered 2019-12-16: 40 mg via ORAL
  Filled 2019-12-16: qty 1

## 2019-12-16 MED ORDER — ACETAMINOPHEN 325 MG PO TABS: 650.0000 mg | ORAL_TABLET | ORAL | Status: DC | PRN

## 2019-12-16 MED ORDER — HEPARIN SODIUM (PORCINE) 5000 UNIT/ML IJ SOLN
5000.0000 [IU] | Freq: Three times a day (TID) | INTRAMUSCULAR | Status: DC
  Administered 2019-12-16: 5000 [IU] via SUBCUTANEOUS
  Filled 2019-12-16: qty 1

## 2019-12-16 MED ORDER — STROKE: EARLY STAGES OF RECOVERY BOOK: Freq: Once | Status: AC

## 2019-12-16 MED ORDER — SENNOSIDES-DOCUSATE SODIUM 8.6-50 MG PO TABS: 1.0000 | ORAL_TABLET | Freq: Every evening | ORAL | Status: DC | PRN

## 2019-12-16 MED ORDER — ADULT MULTIVITAMIN W/MINERALS CH
1.0000 | ORAL_TABLET | Freq: Every day | ORAL | Status: DC
  Administered 2019-12-16: 1 via ORAL
  Filled 2019-12-16: qty 1

## 2019-12-16 MED ORDER — FLUTICASONE PROPIONATE 50 MCG/ACT NA SUSP: 1.0000 | Freq: Every day | NASAL | Status: DC | PRN

## 2019-12-16 MED ORDER — FOLIC ACID 1 MG PO TABS
1.0000 mg | ORAL_TABLET | Freq: Every day | ORAL | Status: DC
  Administered 2019-12-16: 1 mg via ORAL
  Filled 2019-12-16: qty 1

## 2019-12-16 MED ORDER — ESCITALOPRAM OXALATE 10 MG PO TABS
10.0000 mg | ORAL_TABLET | Freq: Every day | ORAL | Status: DC
  Administered 2019-12-16: 10 mg via ORAL
  Filled 2019-12-16: qty 1

## 2019-12-16 MED ORDER — MULTIVITAMINS PO CAPS: 1.0000 | ORAL_CAPSULE | Freq: Every day | ORAL | Status: DC

## 2019-12-16 MED ORDER — SERTRALINE HCL 50 MG PO TABS: 100.0000 mg | ORAL_TABLET | Freq: Every day | ORAL | Status: DC

## 2019-12-16 NOTE — Discharge Summary (Signed)
Physician Discharge Summary  Cheyenne Hamilton FBP:[id number] DOB: 1966-02-06 DOA: 12/15/2019  PCP: Denny Levy, PA  Admit date: 12/15/2019 Discharge date: 12/16/2019  Admitted From: Home  Disposition: HOME   Recommendations for Outpatient Follow-up:  1. Follow up with PCP in 1 weeks 2. Follow up with neurologist in 1 month   Discharge Condition: STABLE   CODE STATUS: FULL    Brief Hospitalization Summary: Please see all hospital notes, images, labs for full details of the hospitalization. ADMISSION HPI:  Cheyenne Hamilton  is a 54 y.o. female, story of OSA on CPAP, diabetes mellitus on Metformin, ocular migraine, psoriasis, patient presents to ED secondary to complaints of tingling and numbness, she does report right hand numbness, with radiation to forearm area, and right side of the mouth face and tongue, this has been alternating, she had 2 episodes in May, one of them was on the left side, and the other one on the right side, with similar presentation, they last for for 5 minutes, followed by headache, the headache is similar to her ocular migraine, clear migraine has been going on for long time, she denies any other focal deficits, weakness, slurred speech, altered mental status or loss of consciousness.  - in ED no significant labs abnormalities beside hemoglobin of 16.3, EKG nonacute, CT head nonacute, patient was seen by teleneurology who recommended admission for TIA/CVA, versus demyelinating disorder work-up.    Pt was admitted for TIA symptoms that have completely resolved. She was sent for MRI / MRA brain and neck and no acute findings seen. She had 2D echocardiogram that was unremarkable. She is feeling better.  She was maintained on aspirin for antiplatelet therapy.  Her symptoms being resolved she is stable to discharge home.  I referred her to outpatient neurologist for follow up.  No changes made to medications.  Close follow up with PCP recommended.  Pt strongly advised to stop  smoking and using all tobacco products.     Discharge Diagnoses:  Active Problems:   Focal neurological deficit   Type 2 diabetes mellitus without complication (HCC)   Tobacco abuse   Discharge Instructions: Discharge Instructions    Ambulatory referral to Neurology   Complete by: As directed    An appointment is requested in approximately: 4 weeks     Allergies as of 12/16/2019      Reactions   Hydrocodone Other (See Comments)   Pt states made her have a rapid heart beat      Medication List    TAKE these medications   aspirin EC 325 MG tablet Take 162.5 mg by mouth once as needed.   clobetasol cream 0.05 % Commonly known as: TEMOVATE Apply 1 application topically 2 (two) times daily as needed.   clonazePAM 0.5 MG tablet Commonly known as: KLONOPIN Take 1 mg by mouth at bedtime as needed for anxiety.   Culturelle Probiotics + Multiv Chew Chew 1-2 each by mouth daily.   doxycycline 100 MG capsule Commonly known as: VIBRAMYCIN Take 100 mg by mouth daily as needed (skin irritation).   escitalopram 10 MG tablet Commonly known as: LEXAPRO Take 10 mg by mouth every morning.   Fish Oil 1000 MG Caps Take 1 capsule by mouth daily.   fluticasone 50 MCG/ACT nasal spray Commonly known as: FLONASE Place 1 spray into both nostrils daily as needed for allergies or rhinitis.   folic acid 1 MG tablet Commonly known as: FOLVITE Take 1 mg by mouth every morning.   furosemide 20  MG tablet Commonly known as: LASIX Take 20 mg by mouth every morning.   Melatonin 5 MG Chew Chew 7.5 mg by mouth at bedtime as needed (FOR SLEEP).   metFORMIN 500 MG 24 hr tablet Commonly known as: GLUCOPHAGE-XR Take 500 mg by mouth every morning.   methotrexate 2.5 MG tablet Commonly known as: RHEUMATREX Take 15 mg by mouth every Monday. 6 pills every monday   omeprazole 20 MG capsule Commonly known as: PRILOSEC Take 20 mg by mouth every morning.   rosuvastatin 5 MG tablet Commonly  known as: CRESTOR Take 5 mg by mouth every morning.       Follow-up Information    [facility], [place]. Schedule an appointment as soon as possible for a visit in 1 week(s).   Specialty: Family Medicine Contact information: [place] [address] [phone]              Allergies  Allergen Reactions  . Hydrocodone Other (See Comments)    Pt states made her have a rapid heart beat   Allergies as of 12/16/2019      Reactions   Hydrocodone Other (See Comments)   Pt states made her have a rapid heart beat      Medication List    TAKE these medications   aspirin EC 325 MG tablet Take 162.5 mg by mouth once as needed.   clobetasol cream 0.05 % Commonly known as: TEMOVATE Apply 1 application topically 2 (two) times daily as needed.   clonazePAM 0.5 MG tablet Commonly known as: KLONOPIN Take 1 mg by mouth at bedtime as needed for anxiety.   Culturelle Probiotics + Multiv Chew Chew 1-2 each by mouth daily.   doxycycline 100 MG capsule Commonly known as: VIBRAMYCIN Take 100 mg by mouth daily as needed (skin irritation).   escitalopram 10 MG tablet Commonly known as: LEXAPRO Take 10 mg by mouth every morning.   Fish Oil 1000 MG Caps Take 1 capsule by mouth daily.   fluticasone 50 MCG/ACT nasal spray Commonly known as: FLONASE Place 1 spray into both nostrils daily as needed for allergies or rhinitis.   folic acid 1 MG tablet Commonly known as: FOLVITE Take 1 mg by mouth every morning.   furosemide 20 MG tablet Commonly known as: LASIX Take 20 mg by mouth every morning.   Melatonin 5 MG Chew Chew 7.5 mg by mouth at bedtime as needed (FOR SLEEP).   metFORMIN 500 MG 24 hr tablet Commonly known as: GLUCOPHAGE-XR Take 500 mg by mouth every morning.   methotrexate 2.5 MG tablet Commonly known as: RHEUMATREX Take 15 mg by mouth every Monday. 6 pills every monday   omeprazole 20 MG capsule Commonly known as: PRILOSEC Take 20 mg by mouth every  morning.   rosuvastatin 5 MG tablet Commonly known as: CRESTOR Take 5 mg by mouth every morning.       Procedures/Studies: CT HEAD WO CONTRAST  Result Date: 12/15/2019 CLINICAL DATA:  Intermittent upper extremity numbness, alternating right and left EXAM: CT HEAD WITHOUT CONTRAST TECHNIQUE: Contiguous axial images were obtained from the base of the skull through the vertex without intravenous contrast. COMPARISON:  None. FINDINGS: Brain: No evidence of acute infarction, hemorrhage, hydrocephalus, extra-axial collection or mass lesion/mass effect. Vascular: No hyperdense vessel or unexpected calcification. Skull: No calvarial fracture or suspicious osseous lesion. No scalp swelling or hematoma. Sinuses/Orbits: Paranasal sinuses and mastoid air cells are predominantly clear. Included orbital structures are unremarkable. Other: None IMPRESSION: No  acute intracranial abnormality. Electronically Signed   By: Lovena Le M.D.   On: 12/15/2019 20:33   MR ANGIO HEAD WO CONTRAST  Result Date: 12/16/2019 CLINICAL DATA:  Right-sided weakness for 2 days EXAM: MRA HEAD WITHOUT CONTRAST TECHNIQUE: Angiographic images of the Circle of Willis were obtained using MRA technique without intravenous contrast. COMPARISON:  None. FINDINGS: Unremarkable intracranial anatomy. Vessels are smooth and widely patent. No branch occlusion, beading, or aneurysm. IMPRESSION: Negative intracranial MRA. Electronically Signed   By: Monte Fantasia M.D.   On: 12/16/2019 09:05   MR ANGIO NECK W WO CONTRAST  Result Date: 12/16/2019 CLINICAL DATA:  Right-sided weakness for 2 days EXAM: MRA NECK WITHOUT AND WITH CONTRAST TECHNIQUE: Multiplanar and multiecho pulse sequences of the neck were obtained without and with intravenous contrast. Angiographic images of the neck were obtained using MRA technique without and with intravenous contrast. CONTRAST:  22mL GADAVIST GADOBUTROL 1 MMOL/ML IV SOLN COMPARISON:  None. FINDINGS: Antegrade flow  in the carotid and vertebral arteries by time-of-flight. No gross abnormality seen on the mask. Normal diameter arch with 3 vessel branching. There is motion artifact at the level of the great vessels and especially the proximal vertebral and common carotid vessels. No flow limiting stenosis is seen in areas not affected by artifact. No beading or visible ulceration. IMPRESSION: Motion artifact limits visualization in the lower neck and upper chest, especially affecting common carotid and vertebral assessment. No acute finding or evidence of stenosis. Electronically Signed   By: Monte Fantasia M.D.   On: 12/16/2019 09:00   MR BRAIN W WO CONTRAST  Result Date: 12/16/2019 CLINICAL DATA:  Right-sided weakness for 2 days EXAM: MRI HEAD WITHOUT AND WITH CONTRAST TECHNIQUE: Multiplanar, multiecho pulse sequences of the brain and surrounding structures were obtained without and with intravenous contrast. CONTRAST:  3mL GADAVIST GADOBUTROL 1 MMOL/ML IV SOLN COMPARISON:  None. FINDINGS: Brain: No acute infarction, hemorrhage, hydrocephalus, extra-axial collection or mass lesion. Normal brain volume. Scattered small FLAIR hyperintensities in the cerebral white matter without specific pattern. In the setting of diabetes and smoking this is most likely chronic small vessel ischemia. Dilated perivascular spaces below the basal ganglia and in the left midbrain. Vascular: Normal flow voids and vascular enhancements. Skull and upper cervical spine: Normal marrow signal Sinuses/Orbits: Negative IMPRESSION: 1. No acute finding or specific explanation for weakness. 2. Mild white matter disease without specific pattern, medical history suggesting chronic small vessel ischemia. Electronically Signed   By: Monte Fantasia M.D.   On: 12/16/2019 09:04   ECHOCARDIOGRAM COMPLETE  Result Date: 12/16/2019    ECHOCARDIOGRAM REPORT   Patient Name:   Cheyenne Hamilton Date of Exam: 12/16/2019 Medical Rec #:  [id number]        Height:        64.0 in Accession #:    [id number]       Weight:       291.4 lb Date of Birth:  1965/06/28        BSA:          2.296 m Patient Age:    54 years         BP:           117/65 mmHg Patient Gender: F                HR:           90 bpm. Exam Location:  [facility] Procedure: 2D Echo Indications:    TIA 435.9 /  G45.9  History:        Patient has no prior history of Echocardiogram examinations.                 Risk Factors:Diabetes and Current Smoker. Acid reflux, ocular                 migraine, tingling and numbness, she does report right hand                 numbness.  Sonographer:    Leavy Cella RDCS (AE) Referring Phys: [id number] [address] [place] IMPRESSIONS  1. Left ventricular ejection fraction, by estimation, is 55 to 60%. The left ventricle has normal function. The left ventricle has no regional wall motion abnormalities. Left ventricular diastolic parameters were normal.  2. Right ventricular systolic function is normal. The right ventricular size is normal. Tricuspid regurgitation signal is inadequate for assessing PA pressure.  3. The mitral valve is grossly normal. Trivial mitral valve regurgitation.  4. The aortic valve is tricuspid. Aortic valve regurgitation is not visualized.  5. The inferior vena cava is normal in size with greater than 50% respiratory variability, suggesting right atrial pressure of 3 mmHg. FINDINGS  Left Ventricle: Left ventricular ejection fraction, by estimation, is 55 to 60%. The left ventricle has normal function. The left ventricle has no regional wall motion abnormalities. The left ventricular internal cavity size was normal in size. There is  no left ventricular hypertrophy. Left ventricular diastolic parameters were normal. Right Ventricle: The right ventricular size is normal. No increase in right ventricular wall thickness. Right ventricular systolic function is normal. Tricuspid regurgitation signal is inadequate for assessing PA pressure. Left Atrium: Left atrial size was  normal in size. Right Atrium: Right atrial size was normal in size. Pericardium: There is no evidence of pericardial effusion. Presence of pericardial fat pad. Mitral Valve: The mitral valve is grossly normal. Trivial mitral valve regurgitation. Tricuspid Valve: The tricuspid valve is grossly normal. Tricuspid valve regurgitation is trivial. Aortic Valve: The aortic valve is tricuspid. Aortic valve regurgitation is not visualized. Pulmonic Valve: The pulmonic valve was grossly normal. Pulmonic valve regurgitation is trivial. Aorta: The aortic root is normal in size and structure. Venous: The inferior vena cava is normal in size with greater than 50% respiratory variability, suggesting right atrial pressure of 3 mmHg. IAS/Shunts: No atrial level shunt detected by color flow Doppler. Rozann Lesches MD Electronically signed by Rozann Lesches MD Signature Date/Time: 12/16/2019/10:35:08 AM    Final       Subjective: Pt reports she feels great, she wants to get home.  She plans to find her own neurologist.    Discharge Exam: Vitals:   12/16/19 0830 12/16/19 1034  BP: 135/72 116/69  Pulse: 84 81  Resp: 16 16  Temp: 97.9 F (36.6 C) 97.8 F (36.6 C)  SpO2: 98% 96%   Vitals:   12/16/19 0432 12/16/19 0626 12/16/19 0830 12/16/19 1034  BP: 115/61 117/65 135/72 116/69  Pulse: 76 90 84 81  Resp: 16 16 16 16   Temp:  98.2 F (36.8 C) 97.9 F (36.6 C) 97.8 F (36.6 C)  TempSrc:  Oral Oral Oral  SpO2: 95% 92% 98% 96%  Weight:      Height:       General: Pt is alert, awake, not in acute distress Cardiovascular: RRR, S1/S2 +, no rubs, no gallops Respiratory: CTA bilaterally, no wheezing, no rhonchi Abdominal: Soft, NT, ND, bowel sounds + Extremities: no edema, no cyanosis Neurological:  nonfocal exam.    The results of significant diagnostics from this hospitalization (including imaging, microbiology, ancillary and laboratory) are listed below for reference.     Microbiology: Recent Results  (from the past 240 hour(s))  SARS Coronavirus 2 by RT PCR (hospital order, performed in [facility] hospital lab) Nasopharyngeal Nasopharyngeal Swab     Status: None   Collection Time: 12/15/19 10:12 PM   Specimen: Nasopharyngeal Swab  Result Value Ref Range Status   SARS Coronavirus 2 NEGATIVE NEGATIVE Final    Comment: (NOTE) SARS-CoV-2 target nucleic acids are NOT DETECTED.  The SARS-CoV-2 RNA is generally detectable in upper and lower respiratory specimens during the acute phase of infection. The lowest concentration of SARS-CoV-2 viral copies this assay can detect is 250 copies / mL. A negative result does not preclude SARS-CoV-2 infection and should not be used as the sole basis for treatment or other patient management decisions.  A negative result may occur with improper specimen collection / handling, submission of specimen other than nasopharyngeal swab, presence of viral mutation(s) within the areas targeted by this assay, and inadequate number of viral copies (<250 copies / mL). A negative result must be combined with clinical observations, patient history, and epidemiological information.  Fact Sheet for Patients:   StrictlyIdeas.no  Fact Sheet for Healthcare Providers: BankingDealers.co.za  This test is not yet approved or  cleared by the [place] FDA and has been authorized for detection and/or diagnosis of SARS-CoV-2 by FDA under an Emergency Use Authorization (EUA).  This EUA will remain in effect (meaning this test can be used) for the duration of the COVID-19 declaration under Section 564(b)(1) of the Act, 21 U.S.C. section 360bbb-3(b)(1), unless the authorization is terminated or revoked sooner.  Performed at [facility], [address], [place], [place] [address]      Labs: BNP (last 3 results) No results for input(s): BNP in the last 8760 hours. Basic Metabolic Panel: Recent Labs  Lab 12/15/19 2036   NA 138  K 3.7  CL 101  CO2 24  GLUCOSE 140*  BUN 12  CREATININE 0.84  CALCIUM 9.1   Liver Function Tests: Recent Labs  Lab 12/15/19 2036  AST 19  ALT 20  ALKPHOS 66  BILITOT 0.4  PROT 7.6  ALBUMIN 4.3   No results for input(s): LIPASE, AMYLASE in the last 168 hours. No results for input(s): AMMONIA in the last 168 hours. CBC: Recent Labs  Lab 12/15/19 2036  WBC 10.6*  NEUTROABS 6.7  HGB 16.3*  HCT 48.3*  MCV 97.0  PLT 332   Cardiac Enzymes: No results for input(s): CKTOTAL, CKMB, CKMBINDEX, TROPONINI in the last 168 hours. BNP: Invalid input(s): POCBNP CBG: Recent Labs  Lab 12/16/19 0243 12/16/19 0834 12/16/19 1130  GLUCAP 167* 110* 176*   D-Dimer No results for input(s): DDIMER in the last 72 hours. Hgb A1c Recent Labs    12/15/19 2036 12/16/19 0500  HGBA1C 6.3* 6.4*   Lipid Profile Recent Labs    12/16/19 0500  CHOL 132  HDL 31*  LDLCALC 49  TRIG 258*  CHOLHDL 4.3   Thyroid function studies No results for input(s): TSH, T4TOTAL, T3FREE, THYROIDAB in the last 72 hours.  Invalid input(s): FREET3 Anemia work up No results for input(s): VITAMINB12, FOLATE, FERRITIN, TIBC, IRON, RETICCTPCT in the last 72 hours. Urinalysis    Component Value Date/Time   COLORURINE YELLOW 12/15/2019 2012   APPEARANCEUR CLEAR 12/15/2019 2012   LABSPEC 1.024 12/15/2019 2012   PHURINE  6.0 12/15/2019 2012   GLUCOSEU NEGATIVE 12/15/2019 2012   HGBUR NEGATIVE 12/15/2019 2012   [place] NEGATIVE 12/15/2019 2012   [place] NEGATIVE 12/15/2019 2012   PROTEINUR NEGATIVE 12/15/2019 2012   NITRITE NEGATIVE 12/15/2019 2012   LEUKOCYTESUR NEGATIVE 12/15/2019 2012   Sepsis Labs Invalid input(s): PROCALCITONIN,  WBC,  LACTICIDVEN Microbiology Recent Results (from the past 240 hour(s))  SARS Coronavirus 2 by RT PCR (hospital order, performed in [place] hospital lab) Nasopharyngeal Nasopharyngeal Swab     Status: None   Collection Time: 12/15/19 10:12 PM    Specimen: Nasopharyngeal Swab  Result Value Ref Range Status   SARS Coronavirus 2 NEGATIVE NEGATIVE Final    Comment: (NOTE) SARS-CoV-2 target nucleic acids are NOT DETECTED.  The SARS-CoV-2 RNA is generally detectable in upper and lower respiratory specimens during the acute phase of infection. The lowest concentration of SARS-CoV-2 viral copies this assay can detect is 250 copies / mL. A negative result does not preclude SARS-CoV-2 infection and should not be used as the sole basis for treatment or other patient management decisions.  A negative result may occur with improper specimen collection / handling, submission of specimen other than nasopharyngeal swab, presence of viral mutation(s) within the areas targeted by this assay, and inadequate number of viral copies (<250 copies / mL). A negative result must be combined with clinical observations, patient history, and epidemiological information.  Fact Sheet for Patients:   StrictlyIdeas.no  Fact Sheet for Healthcare Providers: BankingDealers.co.za  This test is not yet approved or  cleared by the [place] FDA and has been authorized for detection and/or diagnosis of SARS-CoV-2 by FDA under an Emergency Use Authorization (EUA).  This EUA will remain in effect (meaning this test can be used) for the duration of the COVID-19 declaration under Section 564(b)(1) of the Act, 21 U.S.C. section 360bbb-3(b)(1), unless the authorization is terminated or revoked sooner.  Performed at [facility], [address] [place], [place] [address]    Time coordinating discharge:   SIGNED:  Irwin Brakeman, MD  [facility] 12/16/2019, 12:55 PM How to contact the [facility] Attending or Consulting provider [place] or covering provider during after hours [place], for this patient?  1. Check the care team in [facility] and look for a) attending/consulting [facility] provider listed and b) the [facility] team  listed 2. Log into [email or web address] and use [place]'s universal password to access. If you do not have the password, please contact the hospital operator. 3. Locate the [facility] provider you are looking for under [facility] and page to a number that you can be directly reached. 4. If you still have difficulty reaching the provider, please page the [facility] (Director on Call) for the Hospitalists listed on amion for assistance.

## 2019-12-16 NOTE — Progress Notes (Signed)
OT Cancellation Note  Patient Details Name: Cheyenne Hamilton MRN: [id number] DOB: 05-14-1966   Cancelled Treatment:    Reason Eval/Treat Not Completed: Patient at procedure or test/ unavailable. Pt off the floor with MRI. Will check back at a later time.   Guadelupe Sabin, OTR/L  [phone] 12/16/2019, 8:12 AM

## 2019-12-16 NOTE — Progress Notes (Signed)
Pt taken via WC to MRI scanner. Pt alert, oriented x4, amublates without diff or assist. Denies c/o.

## 2019-12-16 NOTE — Progress Notes (Signed)
SLP Cancellation Note  Patient Details Name: Cheyenne Hamilton MRN: [id number] DOB: 1965-11-16   Cancelled treatment:       Reason Eval/Treat Not Completed: SLP screened, no needs identified, will sign off; SLP screened Pt in room. Pt denies any changes in swallowing, speech, language, or cognition. MRI negative for acute changes. SLE will be deferred at this time. Reconsult if indicated. SLP will sign off.  Thank you,  Genene Churn, [place]    [place] 12/16/2019, 10:02 AM

## 2019-12-16 NOTE — Evaluation (Signed)
Physical Therapy Evaluation Patient Details Name: CHRISTINA GINTZ MRN: [id number] DOB: 1966-03-22 Today's Date: 12/16/2019   History of Present Illness  Cheyenne Hamilton  is a 54 y.o. female, story of OSA on CPAP, diabetes mellitus on Metformin, ocular migraine, psoriasis, patient presents to ED secondary to complaints of tingling and numbness, she does report right hand numbness, with radiation to forearm area, and right side of the mouth face and tongue, this has been alternating, she had 2 episodes in May, one of them was on the left side, and the other one on the right side, with similar presentation, they last for for 5 minutes, followed by headache, the headache is similar to her ocular migraine, clear migraine has been going on for long time, she denies any other focal deficits, weakness, slurred speech, altered mental status or loss of consciousness.- in ED no significant labs abnormalities beside hemoglobin of 16.3, EKG nonacute, CT head nonacute, patient was seen by teleneurology who recommended admission for TIA/CVA, versus demyelinating disorder work-up.    Clinical Impression  Patient functioning at baseline for functional mobility and gait.  Plan:  Patient discharged from physical therapy to care of nursing for ambulation daily as tolerated for length of stay.     Follow Up Recommendations No PT follow up    Equipment Recommendations  None recommended by PT    Recommendations for Other Services       Precautions / Restrictions Precautions Precautions: None Restrictions Weight Bearing Restrictions: No      Mobility  Bed Mobility Overal bed mobility: Independent                Transfers Overall transfer level: Independent                  Ambulation/Gait Ambulation/Gait assistance: Modified independent (Device/Increase time) Gait Distance (Feet): 200 Feet Assistive device: None Gait Pattern/deviations: WFL(Within Functional Limits) Gait velocity: near  normal   General Gait Details: grossly WFL except slight antalgic gait on LLE due to baseline lateral ankle pain, otherwise WFL  Stairs Stairs: Yes Stairs assistance: Modified independent (Device/Increase time) Stair Management: One rail Left;Alternating pattern;Step to pattern Number of Stairs: 10 General stair comments: demonstrates good return for going up stairs with alternating pattern and going down stairs with step to pattern due to chronic left ankle pain using 1 siderail  Wheelchair Mobility    Modified Rankin (Stroke Patients Only)       Balance Overall balance assessment: Modified Independent                                           Pertinent Vitals/Pain Pain Assessment: No/denies pain    Home Living Family/patient expects to be discharged to:: Private residence Living Arrangements: Spouse/significant other Available Help at Discharge: Family;Available 24 hours/day Type of Home: House Home Access: Level entry     Home Layout: Two level;Bed/bath upstairs Home Equipment: None      Prior Function Level of Independence: Independent         Comments: Hydrographic surveyor, drives     Journalist, newspaper        Extremity/Trunk Assessment   Upper Extremity Assessment Upper Extremity Assessment: Defer to OT evaluation    Lower Extremity Assessment Lower Extremity Assessment: Overall WFL for tasks assessed    Cervical / Trunk Assessment Cervical / Trunk Assessment: Normal  Communication   Communication: No  difficulties  Cognition Arousal/Alertness: Awake/alert Behavior During Therapy: WFL for tasks assessed/performed Overall Cognitive Status: Within Functional Limits for tasks assessed                                        General Comments      Exercises     Assessment/Plan    PT Assessment Patent does not need any further PT services  PT Problem List         PT Treatment Interventions      PT Goals  (Current goals can be found in the Care Plan section)  Acute Rehab PT Goals Patient Stated Goal: return home with family to assist PT Goal Formulation: With patient Time For Goal Achievement: 12/16/19 Potential to Achieve Goals: Good    Frequency     Barriers to discharge        Co-evaluation               AM-PAC PT "6 Clicks" Mobility  Outcome Measure Help needed turning from your back to your side while in a flat bed without using bedrails?: None Help needed moving from lying on your back to sitting on the side of a flat bed without using bedrails?: None Help needed moving to and from a bed to a chair (including a wheelchair)?: None Help needed standing up from a chair using your arms (e.g., wheelchair or bedside chair)?: None Help needed to walk in hospital room?: None Help needed climbing 3-5 steps with a railing? : None 6 Click Score: 24    End of Session   Activity Tolerance: Patient tolerated treatment well Patient left: in bed;with call bell/phone within reach Nurse Communication: Mobility status PT Visit Diagnosis: Unsteadiness on feet (R26.81);Other abnormalities of gait and mobility (R26.89);Muscle weakness (generalized) (M62.81)    Time: 0017-4944 PT Time Calculation (min) (ACUTE ONLY): 12 min   Charges:   PT Evaluation $PT Eval Low Complexity: 1 Low PT Treatments $Therapeutic Activity: 8-22 mins        10:13 AM, 12/16/19 Lonell Grandchild, MPT Physical Therapist with [facility] [phone] office [phone] mobile phone

## 2019-12-16 NOTE — Progress Notes (Signed)
Stroke handout/booklet given to pt and reviewed with patient and husband. Information includes when to call 911, stroke s/s, importance of medication regimen compliance and follow-up visits. Pt and husband both state understanding and give good verbal return.

## 2019-12-16 NOTE — Plan of Care (Signed)

## 2019-12-16 NOTE — Progress Notes (Signed)
*  PRELIMINARY RESULTS* Echocardiogram 2D Echocardiogram has been performed.  Cheyenne Hamilton 12/16/2019, 9:33 AM

## 2019-12-16 NOTE — Progress Notes (Signed)
Pt discharged to home via wheelchair via POV. All personal belongings in her possession.

## 2019-12-17 ENCOUNTER — Encounter: Payer: Self-pay | Admitting: Neurology

## 2019-12-23 ENCOUNTER — Other Ambulatory Visit: Payer: Self-pay

## 2019-12-23 ENCOUNTER — Encounter (INDEPENDENT_AMBULATORY_CARE_PROVIDER_SITE_OTHER): Payer: Self-pay | Admitting: Family Medicine

## 2019-12-23 ENCOUNTER — Ambulatory Visit (INDEPENDENT_AMBULATORY_CARE_PROVIDER_SITE_OTHER): Payer: Commercial Managed Care - PPO | Admitting: Family Medicine

## 2019-12-23 VITALS — BP 135/86 | HR 76 | Temp 97.7°F | Ht 63.0 in | Wt 290.0 lb

## 2019-12-23 DIAGNOSIS — F418 Other specified anxiety disorders: Secondary | ICD-10-CM

## 2019-12-23 DIAGNOSIS — Z0289 Encounter for other administrative examinations: Secondary | ICD-10-CM

## 2019-12-23 DIAGNOSIS — R0602 Shortness of breath: Secondary | ICD-10-CM | POA: Diagnosis not present

## 2019-12-23 DIAGNOSIS — Z8673 Personal history of transient ischemic attack (TIA), and cerebral infarction without residual deficits: Secondary | ICD-10-CM | POA: Diagnosis not present

## 2019-12-23 DIAGNOSIS — Z9189 Other specified personal risk factors, not elsewhere classified: Secondary | ICD-10-CM

## 2019-12-23 DIAGNOSIS — Z6841 Body Mass Index (BMI) 40.0 and over, adult: Secondary | ICD-10-CM

## 2019-12-23 DIAGNOSIS — R5383 Other fatigue: Secondary | ICD-10-CM

## 2019-12-23 DIAGNOSIS — R739 Hyperglycemia, unspecified: Secondary | ICD-10-CM | POA: Diagnosis not present

## 2019-12-24 LAB — COMPREHENSIVE METABOLIC PANEL
ALT: 27 IU/L (ref 0–32)
AST: 23 IU/L (ref 0–40)
Albumin/Globulin Ratio: 1.8 (ref 1.2–2.2)
Albumin: 4.3 g/dL (ref 3.8–4.9)
Alkaline Phosphatase: 70 IU/L (ref 48–121)
BUN/Creatinine Ratio: 14 (ref 9–23)
BUN: 11 mg/dL (ref 6–24)
Bilirubin Total: 0.4 mg/dL (ref 0.0–1.2)
CO2: 26 mmol/L (ref 20–29)
Calcium: 9.4 mg/dL (ref 8.7–10.2)
Chloride: 101 mmol/L (ref 96–106)
Creatinine, Ser: 0.81 mg/dL (ref 0.57–1.00)
GFR calc Af Amer: 95 mL/min/{1.73_m2} (ref >59)
GFR calc non Af Amer: 83 mL/min/{1.73_m2} (ref >59)
Globulin, Total: 2.4 g/dL (ref 1.5–4.5)
Glucose: 101 mg/dL — ABNORMAL HIGH (ref 65–99)
Potassium: 4.4 mmol/L (ref 3.5–5.2)
Sodium: 141 mmol/L (ref 134–144)
Total Protein: 6.7 g/dL (ref 6.0–8.5)

## 2019-12-24 LAB — CBC WITH DIFFERENTIAL/PLATELET
Basophils Absolute: 0.1 10*3/uL (ref 0.0–0.2)
Basos: 1 %
EOS (ABSOLUTE): 0.4 10*3/uL (ref 0.0–0.4)
Eos: 4 %
Hematocrit: 47 % — ABNORMAL HIGH (ref 34.0–46.6)
Hemoglobin: 15.5 g/dL (ref 11.1–15.9)
Immature Grans (Abs): 0 10*3/uL (ref 0.0–0.1)
Immature Granulocytes: 0 %
Lymphocytes Absolute: 3 10*3/uL (ref 0.7–3.1)
Lymphs: 30 %
MCH: 32 pg (ref 26.6–33.0)
MCHC: 33 g/dL (ref 31.5–35.7)
MCV: 97 fL (ref 79–97)
Monocytes Absolute: 0.6 10*3/uL (ref 0.1–0.9)
Monocytes: 6 %
Neutrophils Absolute: 6 10*3/uL (ref 1.4–7.0)
Neutrophils: 59 %
Platelets: 307 10*3/uL (ref 150–450)
RBC: 4.85 x10E6/uL (ref 3.77–5.28)
RDW: 13.4 % (ref 11.7–15.4)
WBC: 10 10*3/uL (ref 3.4–10.8)

## 2019-12-24 LAB — HEMOGLOBIN A1C
Est. average glucose Bld gHb Est-mCnc: 134 mg/dL
Hgb A1c MFr Bld: 6.3 % — ABNORMAL HIGH (ref 4.8–5.6)

## 2019-12-24 LAB — INSULIN, RANDOM: INSULIN: 28.6 u[IU]/mL — ABNORMAL HIGH (ref 2.6–24.9)

## 2019-12-24 LAB — FOLATE: Folate: >20 ng/mL (ref >3.0)

## 2019-12-24 LAB — VITAMIN D 25 HYDROXY (VIT D DEFICIENCY, FRACTURES): Vit D, 25-Hydroxy: 27.1 ng/mL — ABNORMAL LOW (ref 30.0–100.0)

## 2019-12-24 LAB — VITAMIN B12: Vitamin B-12: 292 pg/mL (ref 232–1245)

## 2019-12-26 ENCOUNTER — Encounter (INDEPENDENT_AMBULATORY_CARE_PROVIDER_SITE_OTHER): Payer: Self-pay | Admitting: Family Medicine

## 2019-12-27 NOTE — Telephone Encounter (Signed)
Please advise 

## 2019-12-28 NOTE — Progress Notes (Signed)
Chief Complaint:   OBESITY Cheyenne Hamilton (MR# [id number]) is a 54 y.o. female who presents for evaluation and treatment of obesity and related comorbidities. Current BMI is Body mass index is 51.37 kg/m. Cheyenne Hamilton has been struggling with her weight for many years and has been unsuccessful in either losing weight, maintaining weight loss, or reaching her healthy weight goal.  Cheyenne Hamilton is currently in the action stage of change and ready to dedicate time achieving and maintaining a healthier weight. Cheyenne Hamilton is interested in becoming our patient and working on intensive lifestyle modifications including (but not limited to) diet and exercise for weight loss.  Cheyenne Hamilton's habits were reviewed today and are as follows: Her family eats meals together, she thinks her family will eat healthier with her, her desired weight loss is 90 lbs, she has been heavy most of her life, she started gaining weight during her first pregnancy, her heaviest weight ever was 298 pounds, she has significant food cravings issues, she snacks frequently in the evenings, she skips meals frequently, she is frequently drinking liquids with calories, she frequently makes poor food choices, she frequently eats larger portions than normal and she struggles with emotional eating.  Depression Screen Cheyenne Hamilton's Food and Mood (modified PHQ-9) score was 11.  Depression screen [facility] 2/9 12/23/2019  Decreased Interest 3  Down, Depressed, Hopeless 1  PHQ - 2 Score 4  Altered sleeping 1  Tired, decreased energy 3  Change in appetite 2  Feeling bad or failure about yourself  0  Trouble concentrating 0  Moving slowly or fidgety/restless 1  Suicidal thoughts 0  PHQ-9 Score 11  Difficult doing work/chores Not difficult at all   Subjective:   1. Other fatigue Takima admits to daytime somnolence and admits to waking up still tired. Patent has a history of symptoms of daytime fatigue. Cheyenne Hamilton generally gets 6 or 7 hours of  sleep per night, and states that she has nightime awakenings. Snoring is not present. Apneic episodes are not present. [name] is 6.  2. Shortness of breath on exertion Cheyenne Hamilton notes increasing shortness of breath with exercising and seems to be worsening over time with weight gain. She notes getting out of breath sooner with activity than she used to. This has not gotten worse recently. [name] denies shortness of breath at rest or orthopnea.  3. Hyperglycemia Cheyenne Hamilton has a history of fasting glucose and A1c, but none above 6.4. She is on metformin now.  4. H/O TIA (transient ischemic attack) and stroke Cheyenne Hamilton is on Crestor, and she had a suspected TIA earlier this month. She is asymptomatic now.  5. Depression with anxiety Cheyenne Hamilton is on Lexapro and she feels it helps, but she does admit to some emotional eating behaviors.  6. At risk for diabetes mellitus Cheyenne Hamilton is at higher than average risk for developing diabetes due to her obesity.   Assessment/Plan:   1. Other fatigue Cheyenne Hamilton does feel that her weight is causing her energy to be lower than it should be. Fatigue may be related to obesity, depression or many other causes. Labs will be ordered, and in the meanwhile, Cheyenne Hamilton will focus on self care including making healthy food choices, increasing physical activity and focusing on stress reduction.  - CBC with Differential/Platelet - Comprehensive metabolic panel - VITAMIN D 25 Hydroxy (Vit-D Deficiency, Fractures) - Vitamin B12 - Folate  2. Shortness of breath on exertion Cheyenne Hamilton does feel that she gets out of breath more easily that  she used to when she exercises. Cheyenne Hamilton's shortness of breath appears to be obesity related and exercise induced. She has agreed to work on weight loss and gradually increase exercise to treat her exercise induced shortness of breath. Will continue to monitor closely.  - CBC with Differential/Platelet  3. Hyperglycemia Fasting  labs will be obtained today and results with be discussed with Cheyenne Hamilton in 2 weeks at her follow up visit. In the meanwhile Cheyenne Hamilton was started on a lower simple carbohydrate diet and will work on weight loss efforts.  - Comprehensive metabolic panel - Hemoglobin A1c - Insulin, random  4. H/O TIA (transient ischemic attack) and stroke Cheyenne Hamilton was encouraged to start ECASA 81 mg daily for now.  5. Depression with anxiety Behavior modification techniques were discussed today to help Brittani deal with her emotional/non-hunger eating behaviors. We will refer to Dr. Mallie Mussel, our Bariatric Psychologist for evaluation. Orders and follow up as documented in patient record.   6. At risk for diabetes mellitus Cheyenne Hamilton was given approximately 30 minutes of diabetes education and counseling today. We discussed intensive lifestyle modifications today with an emphasis on weight loss as well as increasing exercise and decreasing simple carbohydrates in her diet. We also reviewed medication options with an emphasis on risk versus benefit of those discussed.   Repetitive spaced learning was employed today to elicit superior memory formation and behavioral change.  7. Class 3 severe obesity with serious comorbidity and body mass index (BMI) of 50.0 to 59.9 in adult, unspecified obesity type Cheyenne Hamilton ([place])) Cheyenne Hamilton is currently in the action stage of change and her goal is to continue with weight loss efforts. I recommend Cheyenne Hamilton begin the structured treatment plan as follows:  She has agreed to the Category 3 Plan.  Exercise goals: No exercise has been prescribed for now, while we concentrate on nutritional changes.  Behavioral modification strategies: increasing lean protein intake and meal planning and cooking strategies.  She was informed of the importance of frequent follow-up visits to maximize her success with intensive lifestyle modifications for her multiple health conditions. She was informed we would discuss  her lab results at her next visit unless there is a critical issue that needs to be addressed sooner. Cheyenne Hamilton agreed to keep her next visit at the agreed upon time to discuss these results.  Objective:   Blood pressure 135/86, pulse 76, temperature 97.7 F (36.5 C), temperature source Oral, height 5\' 3"  (1.6 m), weight 290 lb (131.5 kg), SpO2 96 %. Body mass index is 51.37 kg/m.  EKG: Normal sinus rhythm, rate tachycardia 101 BPM.  Indirect Calorimeter completed today shows a VO2 of 315 and a REE of 2191.  Her calculated basal metabolic rate is 9563 thus her basal metabolic rate is better than expected.  General: Cooperative, alert, well developed, in no acute distress. HEENT: Conjunctivae and lids unremarkable. Cardiovascular: Regular rhythm.  Lungs: Normal work of breathing. Neurologic: No focal deficits.   Lab Results  Component Value Date   CREATININE 0.81 12/23/2019   BUN 11 12/23/2019   NA 141 12/23/2019   K 4.4 12/23/2019   CL 101 12/23/2019   CO2 26 12/23/2019   Lab Results  Component Value Date   ALT 27 12/23/2019   AST 23 12/23/2019   ALKPHOS 70 12/23/2019   BILITOT 0.4 12/23/2019   Lab Results  Component Value Date   HGBA1C 6.3 (H) 12/23/2019   HGBA1C 6.4 (H) 12/16/2019   HGBA1C 6.3 (H) 12/15/2019   Lab Results  Component Value  Date   INSULIN 28.6 (H) 12/23/2019   No results found for: TSH Lab Results  Component Value Date   CHOL 132 12/16/2019   HDL 31 (L) 12/16/2019   LDLCALC 49 12/16/2019   TRIG 258 (H) 12/16/2019   CHOLHDL 4.3 12/16/2019   Lab Results  Component Value Date   WBC 10.0 12/23/2019   HGB 15.5 12/23/2019   HCT 47.0 (H) 12/23/2019   MCV 97 12/23/2019   PLT 307 12/23/2019   No results found for: IRON, TIBC, FERRITIN  Attestation Statements:   Reviewed by clinician on day of visit: allergies, medications, problem list, medical history, surgical history, family history, social history, and previous encounter notes.   I, Trixie Dredge, am acting as transcriptionist for Dennard Nip, MD.  I have reviewed the above documentation for accuracy and completeness, and I agree with the above. - Dennard Nip, MD

## 2020-01-06 ENCOUNTER — Other Ambulatory Visit: Payer: Self-pay

## 2020-01-06 ENCOUNTER — Encounter (INDEPENDENT_AMBULATORY_CARE_PROVIDER_SITE_OTHER): Payer: Self-pay | Admitting: Family Medicine

## 2020-01-06 ENCOUNTER — Ambulatory Visit (INDEPENDENT_AMBULATORY_CARE_PROVIDER_SITE_OTHER): Payer: 59 | Admitting: Family Medicine

## 2020-01-06 VITALS — BP 115/75 | HR 97 | Temp 98.1°F | Ht 63.0 in | Wt 285.0 lb

## 2020-01-06 DIAGNOSIS — E559 Vitamin D deficiency, unspecified: Secondary | ICD-10-CM

## 2020-01-06 DIAGNOSIS — E66813 Obesity, class 3: Secondary | ICD-10-CM

## 2020-01-06 DIAGNOSIS — Z9189 Other specified personal risk factors, not elsewhere classified: Secondary | ICD-10-CM

## 2020-01-06 DIAGNOSIS — E1169 Type 2 diabetes mellitus with other specified complication: Secondary | ICD-10-CM | POA: Diagnosis not present

## 2020-01-06 DIAGNOSIS — E7849 Other hyperlipidemia: Secondary | ICD-10-CM | POA: Diagnosis not present

## 2020-01-06 DIAGNOSIS — Z6841 Body Mass Index (BMI) 40.0 and over, adult: Secondary | ICD-10-CM

## 2020-01-06 MED ORDER — VITAMIN D (ERGOCALCIFEROL) 1.25 MG (50000 UNIT) PO CAPS: 50000.0000 [IU] | ORAL_CAPSULE | ORAL | 0 refills | Status: DC

## 2020-01-06 NOTE — Progress Notes (Signed)
Chief Complaint:   OBESITY Cheyenne Hamilton is here to discuss her progress with her obesity treatment plan along with follow-up of her obesity related diagnoses. Cheyenne Hamilton is on the Category 3 Plan and states she is following her eating plan approximately 80% of the time. Cheyenne Hamilton states she is doing 0 minutes 0 times per week.  Today's visit was #: 2 Starting weight: 290 lbs Starting date: 12/23/2019 Today's weight: 285 lbs Today's date: 01/06/2020 Total lbs lost to date: 5 Total lbs lost since last in-office visit: 5  Interim History: Cheyenne Hamilton has done well with weight loss on her Category 3 plan. Her hunger is mostly controlled, but she struggled to eat all of her dinner protein.  Subjective:   1. Vitamin D deficiency Cheyenne Hamilton has new diagnosis of Vit D deficiency. Her Vit D level is not at goal, and she denies nausea, vomiting, or muscle weakness. I discussed labs with the patient today.  2. Other hyperlipidemia Cheyenne Hamilton is working on diet and exercise to improve her cholesterol. I discussed labs with the patient today.  3. Type 2 diabetes mellitus with other specified complication, without long-term current use of insulin (HCC) Cheyenne Hamilton's A1c is 6.3. She is stable on metformin, and denies nausea or vomiting. She is doing well with diet and decreasing polyphagia. I discussed labs with the patient today.  4. At risk for heart disease Cheyenne Hamilton is at a higher than average risk for cardiovascular disease due to obesity.   Assessment/Plan:   1. Vitamin D deficiency Low Vitamin D level contributes to fatigue and are associated with obesity, breast, and colon cancer. Cheyenne Hamilton agreed to start prescription Vitamin D 50,000 IU every week with no refills. She will follow-up for routine testing of Vitamin D, at least 2-3 times per year to avoid over-replacement.  - Vitamin D, Ergocalciferol, (DRISDOL) 1.25 MG (50000 UNIT) CAPS capsule; Take 1 capsule (50,000 Units total) by mouth every 7 (seven) days.   Dispense: 4 capsule; Refill: 0  2. Other hyperlipidemia Cardiovascular risk and specific lipid/LDL goals reviewed. We discussed several lifestyle modifications today. Cheyenne Hamilton will continue to work on diet, exercise and weight loss efforts. Orders and follow up as documented in patient record.   3. Type 2 diabetes mellitus with other specified complication, without long-term current use of insulin (HCC) Good blood sugar control is important to decrease the likelihood of diabetic complications such as nephropathy, neuropathy, limb loss, blindness, coronary artery disease, and death. Intensive lifestyle modification including diet, exercise and weight loss are the first line of treatment for diabetes. Cheyenne Hamilton will continue metformin, diet, and exercise, and will follow up as directed.  4. At risk for heart disease Cheyenne Hamilton was given approximately 30 minutes of coronary artery disease prevention counseling today. She is 54 y.o. female and has risk factors for heart disease including obesity. We discussed intensive lifestyle modifications today with an emphasis on specific weight loss instructions and strategies.   Repetitive spaced learning was employed today to elicit superior memory formation and behavioral change.  5. Class 3 severe obesity with serious comorbidity and body mass index (BMI) of 50.0 to 59.9 in adult, unspecified obesity type [facility]) Cheyenne Hamilton is currently in the action stage of change. As such, her goal is to continue with weight loss efforts. She has agreed to the Category 3 Plan + lean meat equivalents.  Behavioral modification strategies: increasing lean protein intake, decreasing simple carbohydrates and meal planning and cooking strategies.  Cheyenne Hamilton has agreed to follow-up with our clinic in  2 weeks. She was informed of the importance of frequent follow-up visits to maximize her success with intensive lifestyle modifications for her multiple health conditions.   Objective:    Blood pressure 115/75, pulse 97, temperature 98.1 F (36.7 C), temperature source Oral, height 5\' 3"  (1.6 m), weight (!) 285 lb (129.3 kg), SpO2 95 %. Body mass index is 50.49 kg/m.  General: Cooperative, alert, well developed, in no acute distress. HEENT: Conjunctivae and lids unremarkable. Cardiovascular: Regular rhythm.  Lungs: Normal work of breathing. Neurologic: No focal deficits.   Lab Results  Component Value Date   CREATININE 0.81 12/23/2019   BUN 11 12/23/2019   NA 141 12/23/2019   K 4.4 12/23/2019   CL 101 12/23/2019   CO2 26 12/23/2019   Lab Results  Component Value Date   ALT 27 12/23/2019   AST 23 12/23/2019   ALKPHOS 70 12/23/2019   BILITOT 0.4 12/23/2019   Lab Results  Component Value Date   HGBA1C 6.3 (H) 12/23/2019   HGBA1C 6.4 (H) 12/16/2019   HGBA1C 6.3 (H) 12/15/2019   Lab Results  Component Value Date   INSULIN 28.6 (H) 12/23/2019   No results found for: TSH Lab Results  Component Value Date   CHOL 132 12/16/2019   HDL 31 (L) 12/16/2019   LDLCALC 49 12/16/2019   TRIG 258 (H) 12/16/2019   CHOLHDL 4.3 12/16/2019   Lab Results  Component Value Date   WBC 10.0 12/23/2019   HGB 15.5 12/23/2019   HCT 47.0 (H) 12/23/2019   MCV 97 12/23/2019   PLT 307 12/23/2019   No results found for: IRON, TIBC, FERRITIN  Attestation Statements:   Reviewed by clinician on day of visit: allergies, medications, problem list, medical history, surgical history, family history, social history, and previous encounter notes.   I, Trixie Dredge, am acting as transcriptionist for Dennard Nip, MD.  I have reviewed the above documentation for accuracy and completeness, and I agree with the above. -  Dennard Nip, MD

## 2020-01-10 NOTE — Telephone Encounter (Signed)
Please advise 
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
## 2020-01-20 ENCOUNTER — Ambulatory Visit (INDEPENDENT_AMBULATORY_CARE_PROVIDER_SITE_OTHER): Payer: Commercial Managed Care - PPO | Admitting: Family Medicine

## 2020-01-20 ENCOUNTER — Other Ambulatory Visit: Payer: Self-pay

## 2020-01-20 ENCOUNTER — Encounter (INDEPENDENT_AMBULATORY_CARE_PROVIDER_SITE_OTHER): Payer: Self-pay | Admitting: Family Medicine

## 2020-01-20 VITALS — BP 114/71 | HR 89 | Temp 97.9°F | Ht 63.0 in | Wt 283.0 lb

## 2020-01-20 DIAGNOSIS — E66813 Obesity, class 3: Secondary | ICD-10-CM

## 2020-01-20 DIAGNOSIS — Z6841 Body Mass Index (BMI) 40.0 and over, adult: Secondary | ICD-10-CM

## 2020-01-20 DIAGNOSIS — E559 Vitamin D deficiency, unspecified: Secondary | ICD-10-CM | POA: Diagnosis not present

## 2020-01-20 MED ORDER — VITAMIN D (ERGOCALCIFEROL) 1.25 MG (50000 UNIT) PO CAPS: 50000.0000 [IU] | ORAL_CAPSULE | ORAL | 0 refills | Status: DC

## 2020-01-25 NOTE — Progress Notes (Signed)
Chief Complaint:   OBESITY Cheyenne Hamilton is here to discuss her progress with her obesity treatment plan along with follow-up of her obesity related diagnoses. Cheyenne Hamilton is on the Category 3 Plan + lean meat equivalents and states she is following her eating plan approximately 80% of the time. Cheyenne Hamilton states she is doing 0 minutes 0 times per week.  Today's visit was #: 3 Starting weight: 290 lbs Starting date: 12/23/2019 Today's weight: 284 lbs Today's date: 01/20/2020 Total lbs lost to date: 6 Total lbs lost since last in-office visit: 1  Interim History: Cheyenne Hamilton continues to do well with weight loss on her Category 3 plan. Her hunger is controlled and she is back to work now and working on adjusting meals, etc.  Subjective:   1. Vitamin D deficiency Cheyenne Hamilton is stable on Vit D, and she denies nausea or vomiting. Her Vit D level is not yet at goal but she feels her fatigue is improving already.  Assessment/Plan:   1. Vitamin D deficiency Low Vitamin D level contributes to fatigue and are associated with obesity, breast, and colon cancer. We will refill prescription Vitamin D for 1 month. Cheyenne Hamilton will follow-up for routine testing of Vitamin D, at least 2-3 times per year to avoid over-replacement.  - Vitamin D, Ergocalciferol, (DRISDOL) 1.25 MG (50000 UNIT) CAPS capsule; Take 1 capsule (50,000 Units total) by mouth every 7 (seven) days.  Dispense: 4 capsule; Refill: 0  2. Class 3 severe obesity with serious comorbidity and body mass index (BMI) of 50.0 to 59.9 in adult, unspecified obesity type Cheyenne Hamilton) Cheyenne Hamilton is currently in the action stage of change. As such, her goal is to continue with weight loss efforts. She has agreed to the Category 3 Plan.   Behavioral modification strategies: increasing lean protein intake and meal planning and cooking strategies.  Cheyenne Hamilton has agreed to follow-up with our clinic in 2 weeks. She was informed of the importance of frequent follow-up visits to  maximize her success with intensive lifestyle modifications for her multiple health conditions.   Objective:   Blood pressure 114/71, pulse 89, temperature 97.9 F (36.6 C), temperature source Oral, height 5\' 3"  (1.6 m), weight 283 lb (128.4 kg), SpO2 94 %. Body mass index is 50.13 kg/m.  General: Cooperative, alert, well developed, in no acute distress. HEENT: Conjunctivae and lids unremarkable. Cardiovascular: Regular rhythm.  Lungs: Normal work of breathing. Neurologic: No focal deficits.   Lab Results  Component Value Date   CREATININE 0.81 12/23/2019   BUN 11 12/23/2019   NA 141 12/23/2019   K 4.4 12/23/2019   CL 101 12/23/2019   CO2 26 12/23/2019   Lab Results  Component Value Date   ALT 27 12/23/2019   AST 23 12/23/2019   ALKPHOS 70 12/23/2019   BILITOT 0.4 12/23/2019   Lab Results  Component Value Date   HGBA1C 6.3 (H) 12/23/2019   HGBA1C 6.4 (H) 12/16/2019   HGBA1C 6.3 (H) 12/15/2019   Lab Results  Component Value Date   INSULIN 28.6 (H) 12/23/2019   No results found for: TSH Lab Results  Component Value Date   CHOL 132 12/16/2019   HDL 31 (L) 12/16/2019   LDLCALC 49 12/16/2019   TRIG 258 (H) 12/16/2019   CHOLHDL 4.3 12/16/2019   Lab Results  Component Value Date   WBC 10.0 12/23/2019   HGB 15.5 12/23/2019   HCT 47.0 (H) 12/23/2019   MCV 97 12/23/2019   PLT 307 12/23/2019   No results  found for: IRON, TIBC, FERRITIN   Attestation Statements:   Reviewed by clinician on day of visit: allergies, medications, problem list, medical history, surgical history, family history, social history, and previous encounter notes.  Time spent on visit including pre-visit chart review and post-visit care and charting was 32 minutes.    I, Trixie Dredge, am acting as transcriptionist for Dennard Nip, MD.  I have reviewed the above documentation for accuracy and completeness, and I agree with the above. -  Dennard Nip, MD

## 2020-01-27 ENCOUNTER — Telehealth (INDEPENDENT_AMBULATORY_CARE_PROVIDER_SITE_OTHER): Payer: Commercial Managed Care - PPO | Admitting: Psychology

## 2020-01-27 DIAGNOSIS — F5089 Other specified eating disorder: Secondary | ICD-10-CM

## 2020-02-02 ENCOUNTER — Other Ambulatory Visit: Payer: Self-pay

## 2020-02-02 ENCOUNTER — Encounter (INDEPENDENT_AMBULATORY_CARE_PROVIDER_SITE_OTHER): Payer: Self-pay | Admitting: Family Medicine

## 2020-02-02 ENCOUNTER — Ambulatory Visit (INDEPENDENT_AMBULATORY_CARE_PROVIDER_SITE_OTHER): Payer: Commercial Managed Care - PPO | Admitting: Family Medicine

## 2020-02-02 VITALS — BP 120/80 | HR 87 | Temp 98.0°F | Ht 63.0 in | Wt 285.0 lb

## 2020-02-02 DIAGNOSIS — Z6841 Body Mass Index (BMI) 40.0 and over, adult: Secondary | ICD-10-CM

## 2020-02-02 DIAGNOSIS — F3289 Other specified depressive episodes: Secondary | ICD-10-CM

## 2020-02-02 NOTE — Progress Notes (Signed)
Chief Complaint:   OBESITY Cheyenne Hamilton is here to discuss her progress with her obesity treatment plan along with follow-up of her obesity related diagnoses. Cheyenne Hamilton is on the Category 3 Plan and states she is following her eating plan approximately 80% of the time. Cheyenne Hamilton states she is doing 0 minutes 0 times per week.  Today's visit was #: 4 Starting weight: 290 lbs Starting date: 12/23/2019 Today's weight: 285 lbs Today's date: 02/02/2020 Total lbs lost to date: 5 Total lbs lost since last in-office visit: 0  Interim History: Cheyenne Hamilton is retaining some fluid today. She hasn't been drinking much water and she feels bloated at times. She doesn't always eat all of her protein at dinner.  Subjective:   1. Other depression, with emotional eating Cheyenne Hamilton notes increased PM cravings, and she is often snacking more at night. This is affecting her weight loss.  Assessment/Plan:   1. Other depression, with emotional eating Emotional eating strategies were discussed, and Cheyenne Hamilton will defer medication options at this time but will follow closely. Orders and follow up as documented in patient record.   2. Class 3 severe obesity with serious comorbidity and body mass index (BMI) of 50.0 to 59.9 in adult, unspecified obesity type Cheyenne Hamilton) Cheyenne Hamilton is currently in the action stage of change. As such, her goal is to continue with weight loss efforts. She has agreed to the Category 3 Plan.   Behavioral modification strategies: increasing water intake.  Cheyenne Hamilton has agreed to follow-up with our clinic in 2 weeks. She was informed of the importance of frequent follow-up visits to maximize her success with intensive lifestyle modifications for her multiple health conditions.   Objective:   Blood pressure 120/80, pulse 87, temperature 98 F (36.7 C), temperature source Oral, height 5\' 3"  (1.6 m), weight 285 lb (129.3 kg), SpO2 96 %. Body mass index is 50.49 kg/m.  General: Cooperative, alert, well  developed, in no acute distress. HEENT: Conjunctivae and lids unremarkable. Cardiovascular: Regular rhythm.  Lungs: Normal work of breathing. Neurologic: No focal deficits.   Lab Results  Component Value Date   CREATININE 0.81 12/23/2019   BUN 11 12/23/2019   NA 141 12/23/2019   K 4.4 12/23/2019   CL 101 12/23/2019   CO2 26 12/23/2019   Lab Results  Component Value Date   ALT 27 12/23/2019   AST 23 12/23/2019   ALKPHOS 70 12/23/2019   BILITOT 0.4 12/23/2019   Lab Results  Component Value Date   HGBA1C 6.3 (H) 12/23/2019   HGBA1C 6.4 (H) 12/16/2019   HGBA1C 6.3 (H) 12/15/2019   Lab Results  Component Value Date   INSULIN 28.6 (H) 12/23/2019   No results found for: TSH Lab Results  Component Value Date   CHOL 132 12/16/2019   HDL 31 (L) 12/16/2019   LDLCALC 49 12/16/2019   TRIG 258 (H) 12/16/2019   CHOLHDL 4.3 12/16/2019   Lab Results  Component Value Date   WBC 10.0 12/23/2019   HGB 15.5 12/23/2019   HCT 47.0 (H) 12/23/2019   MCV 97 12/23/2019   PLT 307 12/23/2019   No results found for: IRON, TIBC, FERRITIN  Attestation Statements:   Reviewed by clinician on day of visit: allergies, medications, problem list, medical history, surgical history, family history, social history, and previous encounter notes.  Time spent on visit including pre-visit chart review and post-visit care and charting was 35 minutes.    Wilhemena Durie, am acting as transcriptionist for Dennard Nip, MD.  I have reviewed the above documentation for accuracy and completeness, and I agree with the above. -  Dennard Nip, MD

## 2020-02-14 ENCOUNTER — Other Ambulatory Visit (INDEPENDENT_AMBULATORY_CARE_PROVIDER_SITE_OTHER): Payer: Self-pay | Admitting: Family Medicine

## 2020-02-14 DIAGNOSIS — E559 Vitamin D deficiency, unspecified: Secondary | ICD-10-CM

## 2020-02-16 NOTE — Progress Notes (Signed)
  Office: [phone]  /  Fax: [phone]    Date: March 01, 2020   Appointment Start Time: 2:00pm Duration: 30 minutes Provider: Glennie Isle, Psy.D. Type of Session: Individual Therapy  Location of Patient: Home Location of Provider: Provider's Home Type of Contact: Telepsychological Visit via MyChart Video Visit/Telephone call  Session Content: This provider called Cheyenne Hamilton at 2:00pm as Epic indicated she joined the [employer]; however, there were no audio/video capabilities. She noted Internet issues; therefore, she provided verbal consent to proceed via a regular telephone call. Cheyenne Hamilton is a 54 y.o. female presenting for a follow-up appointment to address the previously established treatment goal of increasing coping skills. Today's appointment was a telepsychological visit due to COVID-19. Kenlea provided verbal consent for today's telepsychological appointment and she is aware she is responsible for securing confidentiality on her end of the session. Prior to proceeding with today's appointment, [name]'s physical location at the time of this appointment was obtained as well a phone number she could be reached at in the event of technical difficulties. [name] and this provider participated in today's telepsychological service.   This provider conducted a brief check-in. Cheyenne Hamilton shared about recent celebrations, noting she made better choices and engaged in portion control. Positive reinforcement was provided. Reviewed emotional and physical hunger. Psychoeducation regarding triggers for emotional eating was provided. Cheyenne Hamilton was provided a handout, and encouraged to utilize the handout between now and the next appointment to increase awareness of triggers and frequency. Cheyenne Hamilton agreed. This provider also discussed behavioral strategies for specific triggers, such as placing the utensil down when conversing to avoid mindless eating. Cheyenne Hamilton provided verbal consent during  today's appointment for this provider to send a handout about triggers via e-mail. Cheyenne Hamilton was receptive to today's appointment as evidenced by openness to sharing, responsiveness to feedback, and willingness to explore triggers for emotional eating.  Mental Status Examination:  Appearance: unable to assess  Behavior: unable to assess Mood: euthymic Affect: unable to fully assess Speech: normal in rate, volume, and tone Eye Contact: unable to assess Psychomotor Activity: unable to assess  Gait: unable to assess Thought Process: linear, logical, and goal directed  Thought Content/Perception: no hallucinations, delusions, bizarre thinking or behavior reported or observed and no evidence of suicidal and homicidal ideation, plan, and intent Orientation: time, person, place, and purpose of appointment Memory/Concentration: memory, attention, language, and fund of knowledge intact  Insight/Judgment: good  Interventions:  Conducted a brief chart review Provided empathic reflections and validation Reviewed content from the previous session Provided positive reinforcement Employed supportive psychotherapy interventions to facilitate reduced distress and to improve coping skills with identified stressors Psychoeducation provided regarding triggers for emotional eating  DSM-5 Diagnosis(es): 307.59 (F50.8) Other Specified Feeding or Eating Disorder, Emotional Eating Behaviors  Treatment Goal & Progress: During the initial appointment with this provider, the following treatment goal was established: increase coping skills. Cheyenne Hamilton has demonstrated progress in her goal as evidenced by increased awareness of hunger patterns.  Plan: The next appointment will be scheduled in three weeks, which will be via MyChart Video Visit. The next session will focus on working towards the established treatment goal.

## 2020-02-17 ENCOUNTER — Other Ambulatory Visit: Payer: Self-pay

## 2020-02-17 ENCOUNTER — Ambulatory Visit (INDEPENDENT_AMBULATORY_CARE_PROVIDER_SITE_OTHER): Payer: Commercial Managed Care - PPO | Admitting: Family Medicine

## 2020-02-17 ENCOUNTER — Encounter (INDEPENDENT_AMBULATORY_CARE_PROVIDER_SITE_OTHER): Payer: Self-pay | Admitting: Family Medicine

## 2020-02-17 VITALS — BP 119/78 | HR 83 | Temp 97.5°F | Ht 63.0 in | Wt 281.0 lb

## 2020-02-17 DIAGNOSIS — Z6841 Body Mass Index (BMI) 40.0 and over, adult: Secondary | ICD-10-CM

## 2020-02-17 DIAGNOSIS — R7303 Prediabetes: Secondary | ICD-10-CM | POA: Diagnosis not present

## 2020-02-17 DIAGNOSIS — Z9189 Other specified personal risk factors, not elsewhere classified: Secondary | ICD-10-CM

## 2020-02-18 NOTE — Progress Notes (Signed)
Chief Complaint:   OBESITY Cheyenne Hamilton is here to discuss her progress with her obesity treatment plan along with follow-up of her obesity related diagnoses. Cheyenne Hamilton is on the Category 3 Plan and states she is following her eating plan approximately 85% of the time. Cheyenne Hamilton states she is doing 0 minutes 0 times per week.  Today's visit was #: 5 Starting weight: 290 lbs Starting date: 12/23/2019 Today's weight: 281 lbs Today's date: 02/17/2020 Total lbs lost to date: 9 Total lbs lost since last in-office visit: 4  Interim History: Cheyenne Hamilton continues to do well with weight loss. She is following her plan well and her hunger is controlled. She had labs done at her primary care provider's office and she would like to review them today.  Subjective:   1. Pre-diabetes Cheyenne Hamilton A1c has improved from 6.3 to 6.2 with diet and weight loss. Her hunger is controlled and she denies any hypoglycemia. I discussed labs with the patient today.   2. At risk for heart disease Cheyenne Hamilton is at a higher than average risk for cardiovascular disease due to obesity.   Assessment/Plan:   1. Pre-diabetes Cheyenne Hamilton will continue metformin, and will continue to work on weight loss, diet, exercise, and decreasing simple carbohydrates to help decrease the risk of diabetes. We will follow closely.  2. At risk for heart disease Cheyenne Hamilton was given approximately 15 minutes of coronary artery disease prevention counseling today. She is 54 y.o. female and has risk factors for heart disease including obesity. We discussed intensive lifestyle modifications today with an emphasis on specific weight loss instructions and strategies.   Repetitive spaced learning was employed today to elicit superior memory formation and behavioral change.  3. Class 3 severe obesity with serious comorbidity and body mass index (BMI) of 45.0 to 49.9 in adult, unspecified obesity type Cheyenne Hamilton) Cheyenne Hamilton is currently in the action stage of change. As  such, her goal is to continue with weight loss efforts. She has agreed to the Category 3 Plan.   Behavioral modification strategies: increasing lean protein intake.  Cheyenne Hamilton has agreed to follow-up with our clinic in 2 weeks. She was informed of the importance of frequent follow-up visits to maximize her success with intensive lifestyle modifications for her multiple health conditions.   Objective:   Blood pressure 119/78, pulse 83, temperature (!) 97.5 F (36.4 C), height 5\' 3"  (1.6 m), weight 281 lb (127.5 kg), SpO2 97 %. Body mass index is 49.78 kg/m.  General: Cooperative, alert, well developed, in no acute distress. HEENT: Conjunctivae and lids unremarkable. Cardiovascular: Regular rhythm.  Lungs: Normal work of breathing. Neurologic: No focal deficits.   Lab Results  Component Value Date   CREATININE 0.81 12/23/2019   BUN 11 12/23/2019   NA 141 12/23/2019   K 4.4 12/23/2019   CL 101 12/23/2019   CO2 26 12/23/2019   Lab Results  Component Value Date   ALT 27 12/23/2019   AST 23 12/23/2019   ALKPHOS 70 12/23/2019   BILITOT 0.4 12/23/2019   Lab Results  Component Value Date   HGBA1C 6.3 (H) 12/23/2019   HGBA1C 6.4 (H) 12/16/2019   HGBA1C 6.3 (H) 12/15/2019   Lab Results  Component Value Date   INSULIN 28.6 (H) 12/23/2019   No results found for: TSH Lab Results  Component Value Date   CHOL 132 12/16/2019   HDL 31 (L) 12/16/2019   LDLCALC 49 12/16/2019   TRIG 258 (H) 12/16/2019   CHOLHDL 4.3 12/16/2019  Lab Results  Component Value Date   WBC 10.0 12/23/2019   HGB 15.5 12/23/2019   HCT 47.0 (H) 12/23/2019   MCV 97 12/23/2019   PLT 307 12/23/2019   No results found for: IRON, TIBC, FERRITIN  Attestation Statements:   Reviewed by clinician on day of visit: allergies, medications, problem list, medical history, surgical history, family history, social history, and previous encounter notes.   I, Trixie Dredge, am acting as transcriptionist for Dennard Nip, MD.  I have reviewed the above documentation for accuracy and completeness, and I agree with the above. -  Dennard Nip, MD

## 2020-02-22 DIAGNOSIS — R7303 Prediabetes: Secondary | ICD-10-CM | POA: Insufficient documentation

## 2020-02-22 DIAGNOSIS — Z6841 Body Mass Index (BMI) 40.0 and over, adult: Secondary | ICD-10-CM | POA: Insufficient documentation

## 2020-03-01 ENCOUNTER — Ambulatory Visit (INDEPENDENT_AMBULATORY_CARE_PROVIDER_SITE_OTHER): Payer: Commercial Managed Care - PPO | Admitting: Family Medicine

## 2020-03-01 ENCOUNTER — Telehealth (INDEPENDENT_AMBULATORY_CARE_PROVIDER_SITE_OTHER): Payer: Commercial Managed Care - PPO | Admitting: Psychology

## 2020-03-01 ENCOUNTER — Other Ambulatory Visit: Payer: Self-pay

## 2020-03-01 DIAGNOSIS — F5089 Other specified eating disorder: Secondary | ICD-10-CM | POA: Diagnosis not present

## 2020-03-02 ENCOUNTER — Encounter (INDEPENDENT_AMBULATORY_CARE_PROVIDER_SITE_OTHER): Payer: Self-pay | Admitting: Family Medicine

## 2020-03-02 ENCOUNTER — Ambulatory Visit (INDEPENDENT_AMBULATORY_CARE_PROVIDER_SITE_OTHER): Payer: Commercial Managed Care - PPO | Admitting: Family Medicine

## 2020-03-02 ENCOUNTER — Other Ambulatory Visit: Payer: Self-pay

## 2020-03-02 VITALS — BP 124/80 | HR 85 | Temp 97.7°F | Ht 63.0 in | Wt 279.0 lb

## 2020-03-02 DIAGNOSIS — Z6841 Body Mass Index (BMI) 40.0 and over, adult: Secondary | ICD-10-CM

## 2020-03-02 DIAGNOSIS — E559 Vitamin D deficiency, unspecified: Secondary | ICD-10-CM

## 2020-03-02 DIAGNOSIS — R7303 Prediabetes: Secondary | ICD-10-CM

## 2020-03-02 MED ORDER — VITAMIN D (ERGOCALCIFEROL) 1.25 MG (50000 UNIT) PO CAPS: 50000.0000 [IU] | ORAL_CAPSULE | ORAL | 0 refills | Status: DC

## 2020-03-03 NOTE — Progress Notes (Signed)
Chief Complaint:   OBESITY Zareah is here to discuss her progress with her obesity treatment plan along with follow-up of her obesity related diagnoses. Tanaiya is on the Category 3 Plan and states she is following her eating plan approximately 80% of the time. Ciearra states she is doing 0 minutes 0 times per week.  Today's visit was #: 6 Starting weight: 290 lbs Starting date: 12/23/2019 Today's weight: 279 lbs Today's date: 03/02/2020 Total lbs lost to date: 11 Total lbs lost since last in-office visit: 2  Interim History: Latika struggles to eat all of the food on the plan on the weekends. She tends to miss breakfast. She is getting bored with food options. She has lost 20 lbs since December 2020 on our plan..  Subjective:   1. Vitamin D deficiency Laurella's Vit D level is low at 27.1. She is on prescription weekly Vit D 50,000 IU daily.  2. Pre-diabetes Katee's last A1c was 6.3. She denies polyphagia.  Lab Results  Component Value Date   HGBA1C 6.3 (H) 12/23/2019   Lab Results  Component Value Date   INSULIN 28.6 (H) 12/23/2019   Assessment/Plan:   1. Vitamin D deficiency . We will refill prescription Vitamin D for 1 month.   - Vitamin D, Ergocalciferol, (DRISDOL) 1.25 MG (50000 UNIT) CAPS capsule; Take 1 capsule (50,000 Units total) by mouth every 7 (seven) days.  Dispense: 4 capsule; Refill: 0  2. Pre-diabetes Earl will continue metformin.  3. Class 3 severe obesity with serious comorbidity and body mass index (BMI) of 45.0 to 49.9 in adult, unspecified obesity type [facility]) [place] is currently in the action stage of change. As such, her goal is to continue with weight loss efforts. She has agreed to the Category 3 Plan and keeping a food journal and adhering to recommended goals of 450-600 calories and 40 grams of protein at supper daily.   Han[place] given today: Protein Content of Food, and Recipes. Jessah may journal at supper, and she may have a shake  for breakfast.  Exercise goals: No exercise has been prescribed at this time.  Behavioral modification strategies: keeping a strict food journal.  Isela has agreed to follow-up with our clinic in 3 weeks.    Objective:   Blood pressure 124/80, pulse 85, temperature 97.7 F (36.5 C), temperature source Oral, height 5\' 3"  (1.6 m), weight 279 lb (126.6 kg), SpO2 96 %. Body mass index is 49.42 kg/m.  General: Cooperative, alert, well developed, in no acute distress. HEENT: Conjunctivae and lids unremarkable. Cardiovascular: Regular rhythm.  Lungs: Normal work of breathing. Neurologic: No focal deficits.   Lab Results  Component Value Date   CREATININE 0.81 12/23/2019   BUN 11 12/23/2019   NA 141 12/23/2019   K 4.4 12/23/2019   CL 101 12/23/2019   CO2 26 12/23/2019   Lab Results  Component Value Date   ALT 27 12/23/2019   AST 23 12/23/2019   ALKPHOS 70 12/23/2019   BILITOT 0.4 12/23/2019   Lab Results  Component Value Date   HGBA1C 6.3 (H) 12/23/2019   HGBA1C 6.4 (H) 12/16/2019   HGBA1C 6.3 (H) 12/15/2019   Lab Results  Component Value Date   INSULIN 28.6 (H) 12/23/2019   No results found for: TSH Lab Results  Component Value Date   CHOL 132 12/16/2019   HDL 31 (L) 12/16/2019   LDLCALC 49 12/16/2019   TRIG 258 (H) 12/16/2019   CHOLHDL 4.3 12/16/2019   Lab Results  Component Value Date   WBC 10.0 12/23/2019   HGB 15.5 12/23/2019   HCT 47.0 (H) 12/23/2019   MCV 97 12/23/2019   PLT 307 12/23/2019   No results found for: IRON, TIBC, FERRITIN  Attestation Statements:   Reviewed by clinician on day of visit: allergies, medications, problem list, medical history, surgical history, family history, social history, and previous encounter notes.   Wilhemena Durie, am acting as Location manager for [employer], FNP-C.  I have reviewed the above documentation for accuracy and completeness, and I agree with the above. -  Georgianne Fick, FNP

## 2020-03-06 DIAGNOSIS — E559 Vitamin D deficiency, unspecified: Secondary | ICD-10-CM | POA: Insufficient documentation

## 2020-03-08 NOTE — Progress Notes (Signed)
  Office: [phone]  /  Fax: [phone]    Date: March 22, 2020   Appointment Start Time: 3:02pm Duration: 27 minutes Provider: Glennie Hamilton, Psy.D. Type of Session: Individual Therapy  Location of Patient: Home Location of Provider: Provider's Home Type of Contact: Telepsychological Visit via MyChart Video Visit  Session Content: Cheyenne Hamilton is a 54 y.o. female presenting for a follow-up appointment to address the previously established treatment goal of increasing coping skills. Today's appointment was a telepsychological visit due to COVID-19. Cheyenne Hamilton provided verbal consent for today's telepsychological appointment and she is aware she is responsible for securing confidentiality on her end of the session. Prior to proceeding with today's appointment, Cheyenne Hamilton's physical location at the time of this appointment was obtained as well a phone number she could be reached at in the event of technical difficulties. Cheyenne Hamilton and this provider participated in today's telepsychological service.   This provider conducted a brief check-in. Cheyenne Hamilton shared about her recent vacation, noting she gained "fluid weight." She discussed her appointment with Dr. Leafy Hamilton. Reviewed triggers for emotional eating. She acknowledged squelch urgency comes up often, specifically when thinking about pizza and [employer] cups. As such, this provider and Cheyenne Hamilton discussed the dieting mentality as well as all or nothing. Cheyenne Hamilton expressed desire to cease smoking. She was encouraged to discuss the aforementioned with Dr. Leafy Hamilton during their next appointment, as Cheyenne Hamilton expressed concern about potential weight gain. She agreed. Notably, this provider discussed her upcoming maternity leave toward the end of November. Cheyenne Hamilton acknowledged understanding given the uncertain nature of the circumstances, this provider may be out of the office sooner. This provider and Cheyenne Hamilton discussed referral options and verbal consent was provided for  this provider to send a list of referral options via e-mail. All questions/concerns were addressed. Cheyenne Hamilton denied any concerns. Cheyenne Hamilton was receptive to today's appointment as evidenced by openness to sharing and responsiveness to feedback.  Mental Status Examination:  Appearance: well groomed and appropriate hygiene  Behavior: appropriate to circumstances Mood: euthymic Affect: mood congruent Speech: normal in rate, volume, and tone Eye Contact: appropriate Psychomotor Activity: appropriate Gait: unable to assess Thought Process: linear, logical, and goal directed  Thought Content/Perception: no hallucinations, delusions, bizarre thinking or behavior reported or observed and no evidence of suicidal and homicidal ideation, plan, and intent Orientation: time, person, place, and purpose of appointment Memory/Concentration: memory, attention, language, and fund of knowledge intact  Insight/Judgment: fair  Interventions:  Conducted a brief chart review Provided empathic reflections and validation Reviewed content from the previous session Employed supportive psychotherapy interventions to facilitate reduced distress and to improve coping skills with identified stressors Psychoeducation provided regarding all-or-nothing thinking  DSM-5 Diagnosis(es): 307.59 (F50.8) Other Specified Feeding or Eating Disorder, Emotional Eating Behaviors  Treatment Goal & Progress: During the initial appointment with this provider, the following treatment goal was established: increase coping skills. Cheyenne Hamilton has demonstrated progress in her goal as evidenced by increased awareness of hunger patterns and increased awareness of triggers for emotional eating.   Plan: Due to Cheyenne Hamilton's upcoming vacation in approximately, the next appointment will be scheduled in approximately  one week, which will be via MyChart Video Visit. The next session will focus on working towards the established treatment goal.

## 2020-03-21 NOTE — Progress Notes (Signed)
NEUROLOGY CONSULTATION NOTE  Cheyenne Hamilton MRN: [id number] DOB: 12/23/1965  Referring provider: Irwin Brakeman, MD (hospital referral) Primary care provider: Denny Levy, [place]  Reason for consult:  TIA  HISTORY OF PRESENT ILLNESS: Cheyenne Hamilton is a 54 year old right-handed female with ocular migraines, diabetes, psoriasis, and HLD who presents for TIA.  History supplemented by hospital records.    May - sitting at home and fingertips numb on right hand.  Sat on hands.  Radiated up hand to the wrist and then inside of mouth on right down middle palate and tongue 3 to 5 minutes.  Following week left side.  Then again in July right-sided but this time lip and traveled up arm to clavicle.  7 minutes.    In May, she was sitting at home when suddenly she felt numbness and tingling in the fingertips of her right hand that traveled down the palm of her hand to the wrist.  She then developed numbness and tingling on the right side of her inner mouth, including palate and tongue.  No weakness.  Episode lasted 3 to 5 minutes.  The following week, the same thing occurred but on the left side.  On 12/15/2019, she had a recurrence on the right side.  However, the paresthesias traveled up her entire right arm to the clavicle and the right sided facial numbness spread to include the right side of her lips.  The entire event lasted about 7 minutes.  No headache, facial droop, or unilateral weakness.  She went to [facility] on 12/15/2019 where she was admitted for stroke workup.  CT of head showed no acute intracranial abnormality.  Follow up MRI of brain also did not demonstrate acute infarct or other acute abnormality.  MRA of head and neck showed no large vessel occlusion or hemodynamically significant stenosis.  2D echocardiogram showed normal EF of 55-60% with no cardiac source of embolus.  Labs included LDL 49, TG 258, Hgb A1c 6.4, HIV negative, Coronavirus negative, B12 292, and folate >20.   She was started on ASA 81mg  daily.  She was continued on Crestor 5mg , which was initiated about 3 months prior to presentation.   She reports history of ocular migraines since her early 29s.  She describes a wavy line in her peripheral vision, may involve either eye, usually lasting 10 minutes.  Sometimes it may be followed by a mild headache for 15 to 20 minutes.  No associated nausea, photophobia, and phonophobia.  They would occur infrequently, once in awhile.  Over the past year, they have occurred a little more frequently, once every 2 or 3 months.  However, she had 3 episodes one week prior to hospitalization in July.  She has not had another transient spell of numbness or ocular migraine since then.    She is a smoker, smokes 7 cigarettes a day.  She has OSA.    PAST MEDICAL HISTORY: Past Medical History:  Diagnosis Date  . Acid reflux   . Anxiety   . Back pain   . Complication of anesthesia    Hard to wake up  . Depression   . GERD (gastroesophageal reflux disease)   . High triglycerides   . Hyperlipidemia   . IBS (irritable bowel syndrome)   . Knee pain   . Lower extremity edema   . Ocular migraine   . Palpitations   . PCOS (polycystic ovarian syndrome)   . Prediabetes   . Psoriasis   . Sleep  apnea     PAST SURGICAL HISTORY: Past Surgical History:  Procedure Laterality Date  . BIOPSY  04/18/2017   Procedure: BIOPSY;  Surgeon: Daneil Dolin, MD;  Location: [facility];  Service: Endoscopy;;  ascending/descending/sigmoid  . CERVICAL CERCLAGE    . CHOLECYSTECTOMY    . COLONOSCOPY WITH PROPOFOL N/A 04/18/2017   Procedure: COLONOSCOPY WITH PROPOFOL;  Surgeon: Daneil Dolin, MD;  Location: [facility];  Service: Endoscopy;  Laterality: N/A;  7:30am  . POLYPECTOMY  04/18/2017   Procedure: POLYPECTOMY;  Surgeon: Daneil Dolin, MD;  Location: [facility];  Service: Endoscopy;;  transverse colon    MEDICATIONS: Current Outpatient Medications on File Prior to Visit    Medication Sig Dispense Refill  . aspirin EC 81 MG tablet Take 81 mg by mouth daily. Swallow whole.    . clobetasol cream (TEMOVATE) 1.61 % Apply 1 application topically 2 (two) times daily as needed.    . clonazePAM (KLONOPIN) 0.5 MG tablet Take 1 mg by mouth at bedtime as needed for anxiety.     . diclofenac Sodium (VOLTAREN) 1 % GEL Apply 2 g topically 4 (four) times daily as needed.    . doxycycline (VIBRAMYCIN) 100 MG capsule Take 100 mg by mouth daily as needed (skin irritation).     Marland Kitchen escitalopram (LEXAPRO) 10 MG tablet Take 10 mg by mouth every morning.     . fluticasone (FLONASE) 50 MCG/ACT nasal spray Place 1 spray into both nostrils daily as needed for allergies or rhinitis.    . folic acid (FOLVITE) 1 MG tablet Take 1 mg by mouth every morning.     . furosemide (LASIX) 20 MG tablet Take 20 mg by mouth every morning.     . loperamide (IMODIUM) 2 MG capsule Take by mouth as needed for diarrhea or loose stools.    . Melatonin 5 MG CHEW Chew 2.5 mg by mouth at bedtime as needed (FOR SLEEP).     . metFORMIN (GLUCOPHAGE-XR) 500 MG 24 hr tablet Take 500 mg by mouth every morning.     . methotrexate (RHEUMATREX) 2.5 MG tablet Take 15 mg by mouth every Monday. 6 pills every monday     . metroNIDAZOLE (METROCREAM) 0.75 % cream Apply 1 application topically 2 (two) times daily as needed.    . Multiple Vitamins-Minerals (CENTRUM SILVER 50+WOMEN) TABS Take 1 tablet by mouth daily.    . Multiple Vitamins-Minerals (CULTURELLE PROBIOTICS + MULTIV) CHEW Chew 1-2 each by mouth daily.    . Omega-3 Fatty Acids (FISH OIL) 1000 MG CAPS Take 1 capsule by mouth daily.    Marland Kitchen omeprazole (PRILOSEC) 20 MG capsule Take 20 mg by mouth every morning.     . rosuvastatin (CRESTOR) 5 MG tablet Take 5 mg by mouth every morning.     . Vitamin D, Ergocalciferol, (DRISDOL) 1.25 MG (50000 UNIT) CAPS capsule Take 1 capsule (50,000 Units total) by mouth every 7 (seven) days. 4 capsule 0   No current facility-administered  medications on file prior to visit.    ALLERGIES: Allergies  Allergen Reactions  . Hydrocodone Other (See Comments)    Pt states made her have a rapid heart beat    FAMILY HISTORY: Family History  Problem Relation Age of Onset  . Arthritis Other   . Hypertension Mother   . Thyroid disease Mother   . Obesity Mother   . Anxiety disorder Mother   . Hypertension Father   . Obesity Father  SOCIAL HISTORY: Social History   Socioeconomic History  . Marital status: Married    Spouse name: Katielynn Horan  . Number of children: Not on file  . Years of education: 28  . Highest education level: Not on file  Occupational History  . Occupation: Dental Hygentist  Tobacco Use  . Smoking status: Current Every Day Smoker    Packs/day: 0.75    Years: 30.00    Pack years: 22.50  . Smokeless tobacco: Never Used  . Tobacco comment: smoked last at 6:00am  Substance and Sexual Activity  . Alcohol use: No  . Drug use: No  . Sexual activity: Not on file  Other Topics Concern  . Not on file  Social History Narrative  . Not on file   Social Determinants of Health   Financial Resource Strain:   . Difficulty of Paying Living Expenses: Not on file  Food Insecurity:   . Worried About Charity fundraiser in the Last Year: Not on file  . Ran Out of Food in the Last Year: Not on file  Transportation Needs:   . Lack of Transportation (Medical): Not on file  . Lack of Transportation (Non-Medical): Not on file  Physical Activity:   . Days of Exercise per Week: Not on file  . Minutes of Exercise per Session: Not on file  Stress:   . Feeling of Stress : Not on file  Social Connections:   . Frequency of Communication with Friends and Family: Not on file  . Frequency of Social Gatherings with Friends and Family: Not on file  . Attends Religious Services: Not on file  . Active Member of Clubs or Organizations: Not on file  . Attends Archivist Meetings: Not on file  . Marital  Status: Not on file  Intimate Partner Violence:   . Fear of Current or Ex-Partner: Not on file  . Emotionally Abused: Not on file  . Physically Abused: Not on file  . Sexually Abused: Not on file     PHYSICAL EXAM: Blood pressure 122/75, pulse 87, height 5\' 4"  (1.626 m), weight 290 lb 9.6 oz (131.8 kg), SpO2 95 %. General: No acute distress.  Patient appears well-groomed.   Head:  Normocephalic/atraumatic Eyes:  fundi examined but not visualized Neck: supple, no paraspinal tenderness, full range of motion Back: No paraspinal tenderness Heart: regular rate and rhythm Lungs: Clear to auscultation bilaterally. Vascular: No carotid bruits. Neurological Exam: Mental status: alert and oriented to person, place, and time, recent and remote memory intact, fund of knowledge intact, attention and concentration intact, speech fluent and not dysarthric, language intact. Cranial nerves: CN I: not tested CN II: pupils equal, round and reactive to light, visual fields intact CN III, IV, VI:  full range of motion, no nystagmus, no ptosis CN V: facial sensation intact CN VII: upper and lower face symmetric CN VIII: hearing intact CN IX, X: gag intact, uvula midline CN XI: sternocleidomastoid and trapezius muscles intact CN XII: tongue midline Bulk & Tone: normal, no fasciculations. Motor:  5/5 throughout and vibration sensation intact.   Deep Tendon Reflexes:  2+ throughout, toes downgoing.   Finger to nose testing:  Without dysmetria.   Heel to shin:  Without dysmetria.   Gait:  Normal station and stride.   Romberg negative.  IMPRESSION: 1. Recurrent transient upper extremity and facial numbness.  Cannot rule out recurrent TIA.  However, the fact that she had an episode involve the contralateral side makes recurrent TIA  less likely.  Given her history of ocular migraines with increased frequency during the time these transient spells were occurring, consider migraine aura without headache.   She should still continue treatment for secondary stroke prevention. 2.  Hyperlipidemia 3.  Tobacco use disorder 4.  Sleep apnea 5.  Prediabetes  PLAN: 1.  Continue ASA 81mg  daily 2.  Continue Crestor 5mg  daily.  LDL goal less than 70 3.  Glycemic control.  Hgb A1c goal less than 7. 4.  Blood pressure control. 5.  Routine exercise 6.  Mediterranean diet 7.  Spent 5 minutes discussing importance of smoking cessation.  She is interested in trying to quit smoking.   8.  Use CPAP for OSA treatment. 9.  Follow up in 4 to 6 months.  Thank you for allowing me to take part in the care of this patient.  Metta Clines, DO  CC:  Denny Levy, [place]

## 2020-03-22 ENCOUNTER — Ambulatory Visit (INDEPENDENT_AMBULATORY_CARE_PROVIDER_SITE_OTHER): Payer: Commercial Managed Care - PPO | Admitting: Neurology

## 2020-03-22 ENCOUNTER — Other Ambulatory Visit: Payer: Self-pay

## 2020-03-22 ENCOUNTER — Ambulatory Visit (INDEPENDENT_AMBULATORY_CARE_PROVIDER_SITE_OTHER): Payer: Commercial Managed Care - PPO | Admitting: Family Medicine

## 2020-03-22 ENCOUNTER — Encounter (INDEPENDENT_AMBULATORY_CARE_PROVIDER_SITE_OTHER): Payer: Self-pay | Admitting: Family Medicine

## 2020-03-22 ENCOUNTER — Telehealth (INDEPENDENT_AMBULATORY_CARE_PROVIDER_SITE_OTHER): Payer: Commercial Managed Care - PPO | Admitting: Psychology

## 2020-03-22 ENCOUNTER — Encounter: Payer: Self-pay | Admitting: Neurology

## 2020-03-22 VITALS — BP 127/84 | HR 86 | Temp 97.9°F | Ht 63.0 in | Wt 287.0 lb

## 2020-03-22 VITALS — BP 122/75 | HR 87 | Ht 64.0 in | Wt 290.6 lb

## 2020-03-22 DIAGNOSIS — Z720 Tobacco use: Secondary | ICD-10-CM | POA: Diagnosis not present

## 2020-03-22 DIAGNOSIS — Z6841 Body Mass Index (BMI) 40.0 and over, adult: Secondary | ICD-10-CM

## 2020-03-22 DIAGNOSIS — Z9189 Other specified personal risk factors, not elsewhere classified: Secondary | ICD-10-CM

## 2020-03-22 DIAGNOSIS — R600 Localized edema: Secondary | ICD-10-CM | POA: Diagnosis not present

## 2020-03-22 DIAGNOSIS — F5089 Other specified eating disorder: Secondary | ICD-10-CM | POA: Diagnosis not present

## 2020-03-22 DIAGNOSIS — E66813 Obesity, class 3: Secondary | ICD-10-CM

## 2020-03-22 DIAGNOSIS — G43109 Migraine with aura, not intractable, without status migrainosus: Secondary | ICD-10-CM

## 2020-03-22 DIAGNOSIS — G4733 Obstructive sleep apnea (adult) (pediatric): Secondary | ICD-10-CM

## 2020-03-22 DIAGNOSIS — E559 Vitamin D deficiency, unspecified: Secondary | ICD-10-CM

## 2020-03-22 DIAGNOSIS — Z9989 Dependence on other enabling machines and devices: Secondary | ICD-10-CM

## 2020-03-22 DIAGNOSIS — G459 Transient cerebral ischemic attack, unspecified: Secondary | ICD-10-CM

## 2020-03-22 DIAGNOSIS — R7303 Prediabetes: Secondary | ICD-10-CM

## 2020-03-22 MED ORDER — VITAMIN D (ERGOCALCIFEROL) 1.25 MG (50000 UNIT) PO CAPS: 50000.0000 [IU] | ORAL_CAPSULE | ORAL | 0 refills | Status: DC

## 2020-03-22 NOTE — Patient Instructions (Signed)
The episodes of numbness possibly were migraines but we need to treat for secondary stroke prevention. 1.  Continue aspirin 81mg  daily 2.  Continue Crestor 3.  If you have sleep apnea, recommend COPAP 4.  Mediterranean diet (see below) 5.  Routine exercise 6.  Please contact your PCP about trying to quit smoking 7.  Follow up in 4 to 6 months.   Mediterranean Diet A Mediterranean diet refers to food and lifestyle choices that are based on the traditions of countries located on the [employer]. This way of eating has been shown to help prevent certain conditions and improve outcomes for people who have chronic diseases, like kidney disease and heart disease. What are tips for following this plan? Lifestyle  Cook and eat meals together with your family, when possible.  Drink enough fluid to keep your urine clear or pale yellow.  Be physically active every day. This includes: ? Aerobic exercise like running or swimming. ? Leisure activities like gardening, walking, or housework.  Get 7-8 hours of sleep each night.  If recommended by your health care provider, drink red wine in moderation. This means 1 glass a day for nonpregnant women and 2 glasses a day for men. A glass of wine equals 5 oz (150 mL). Reading food labels   Check the serving size of packaged foods. For foods such as rice and pasta, the serving size refers to the amount of cooked product, not dry.  Check the total fat in packaged foods. Avoid foods that have saturated fat or trans fats.  Check the ingredients list for added sugars, such as corn syrup. Shopping  At the grocery store, buy most of your food from the areas near the walls of the store. This includes: ? Fresh fruits and vegetables (produce). ? Grains, beans, nuts, and seeds. Some of these may be available in unpackaged forms or large amounts (in bulk). ? Fresh seafood. ? Poultry and eggs. ? Low-fat dairy products.  Buy whole ingredients instead  of prepackaged foods.  Buy fresh fruits and vegetables in-season from local farmers markets.  Buy frozen fruits and vegetables in resealable bags.  If you do not have access to quality fresh seafood, buy precooked frozen shrimp or canned fish, such as tuna, salmon, or sardines.  Buy small amounts of raw or cooked vegetables, salads, or olives from the deli or salad bar at your store.  Stock your pantry so you always have certain foods on hand, such as olive oil, canned tuna, canned tomatoes, rice, pasta, and beans. Cooking  Cook foods with extra-virgin olive oil instead of using butter or other vegetable oils.  Have meat as a side dish, and have vegetables or grains as your main dish. This means having meat in small portions or adding small amounts of meat to foods like pasta or stew.  Use beans or vegetables instead of meat in common dishes like chili or lasagna.  Experiment with different cooking methods. Try roasting or broiling vegetables instead of steaming or sauteing them.  Add frozen vegetables to soups, stews, pasta, or rice.  Add nuts or seeds for added healthy fat at each meal. You can add these to yogurt, salads, or vegetable dishes.  Marinate fish or vegetables using olive oil, lemon juice, garlic, and fresh herbs. Meal planning   Plan to eat 1 vegetarian meal one day each week. Try to work up to 2 vegetarian meals, if possible.  Eat seafood 2 or more times a week.  Have healthy  snacks readily available, such as: ? Vegetable sticks with hummus. ? [place] yogurt. ? Fruit and nut trail mix.  Eat balanced meals throughout the week. This includes: ? Fruit: 2-3 servings a day ? Vegetables: 4-5 servings a day ? Low-fat dairy: 2 servings a day ? Fish, poultry, or lean meat: 1 serving a day ? Beans and legumes: 2 or more servings a week ? Nuts and seeds: 1-2 servings a day ? Whole grains: 6-8 servings a day ? Extra-virgin olive oil: 3-4 servings a day  Limit red  meat and sweets to only a few servings a month What are my food choices?  Mediterranean diet ? Recommended  Grains: Whole-grain pasta. Brown rice. Bulgar wheat. Polenta. Couscous. Whole-wheat bread. [place].  Vegetables: Artichokes. Beets. Broccoli. Cabbage. Carrots. Eggplant. Green beans. Chard. Kale. Spinach. Onions. Leeks. Peas. Squash. Tomatoes. Peppers. Radishes.  Fruits: Apples. Apricots. Avocado. Berries. Bananas. Cherries. Dates. Figs. Grapes. Lemons. Melon. Oranges. Peaches. Plums. Pomegranate.  Meats and other protein foods: Beans. Almonds. Sunflower seeds. Pine nuts. Peanuts. [place]. Salmon. Scallops. Shrimp. [place]. Tilapia. Clams. Oysters. Eggs.  Dairy: Low-fat milk. Cheese. Greek yogurt.  Beverages: Water. Red wine. Herbal tea.  Fats and oils: Extra virgin olive oil. Avocado oil. Grape seed oil.  Sweets and desserts: [place] yogurt with honey. Baked apples. Poached pears. Trail mix.  Seasoning and other foods: Basil. Cilantro. Coriander. Cumin. Mint. Parsley. Sage. Rosemary. Tarragon. Garlic. Oregano. Thyme. Pepper. Balsalmic vinegar. Tahini. Hummus. Tomato sauce. Olives. Mushrooms. ? Limit these  Grains: Prepackaged pasta or rice dishes. Prepackaged cereal with added sugar.  Vegetables: Deep fried potatoes (french fries).  Fruits: Fruit canned in syrup.  Meats and other protein foods: Beef. Pork. Lamb. Poultry with skin. Hot dogs. Berniece Salines.  Dairy: Ice cream. Sour cream. Whole milk.  Beverages: Juice. Sugar-sweetened soft drinks. Beer. Liquor and spirits.  Fats and oils: Butter. Canola oil. Vegetable oil. Beef fat (tallow). Lard.  Sweets and desserts: Cookies. Cakes. Pies. Candy.  Seasoning and other foods: Mayonnaise. Premade sauces and marinades. The items listed may not be a complete list. Talk with your dietitian about what dietary choices are right for you. Summary  The Mediterranean diet includes both food and lifestyle choices.  Eat a variety of  fresh fruits and vegetables, beans, nuts, seeds, and whole grains.  Limit the amount of red meat and sweets that you eat.  Talk with your health care provider about whether it is safe for you to drink red wine in moderation. This means 1 glass a day for nonpregnant women and 2 glasses a day for men. A glass of wine equals 5 oz (150 mL). This information is not intended to replace advice given to you by your health care provider. Make sure you discuss any questions you have with your health care provider. Document Revised: 01/25/2016 Document Reviewed: 01/18/2016 Elsevier Patient Education  [employer].

## 2020-03-22 NOTE — Progress Notes (Unsigned)
  Office: [phone]  /  Fax: [phone]    Date: March 30, 2020   Appointment Start Time: *** Duration: *** minutes Provider: Glennie Isle, Psy.D. Type of Session: Individual Therapy  Location of Patient: {gbptloc:23249} Location of Provider: Provider's Home Type of Contact: Telepsychological Visit via MyChart Video Visit  Session Content: This provider called [name] at 3:02pm as she did not present for the telepsychological appointment. A HIPAA compliant voicemail was left requesting a call back. As such, today's appointment was initiated *** minutes late.  Cheyenne Hamilton is a 54 y.o. female presenting for a follow-up appointment to address the previously established treatment goal of increasing coping skills. Today's appointment was a telepsychological visit due to COVID-19. Quenna provided verbal consent for today's telepsychological appointment and she is aware she is responsible for securing confidentiality on her end of the session. Prior to proceeding with today's appointment, Coriana's physical location at the time of this appointment was obtained as well a phone number she could be reached at in the event of technical difficulties. Bhumi and this provider participated in today's telepsychological service.   This provider conducted a brief check-in. *** Psychoeducation regarding mindfulness was provided. A handout was provided to [facility] with further information regarding mindfulness, including exercises. This provider also explained the benefit of mindfulness as it relates to emotional eating. Allex was encouraged to engage in the provided exercises between now and the next appointment with this provider. [name] agreed. During today's appointment, Baker was led through a mindfulness exercise involving her senses. Keyanna provided verbal consent during today's appointment for this provider to send a handout about mindfulness via e-mail. Dnya was receptive to today's appointment as  evidenced by openness to sharing, responsiveness to feedback, and {gbreceptiveness:23401}.  Mental Status Examination:  Appearance: {Appearance:22431} Behavior: {Behavior:22445} Mood: {gbmood:21757} Affect: {Affect:22436} Speech: {Speech:22432} Eye Contact: {Eye Contact:22433} Psychomotor Activity: {Motor Activity:22434} Gait: {gbgait:23404} Thought Process: {thought process:22448}  Thought Content/Perception: {disturbances:22451} Orientation: {Orientation:22437} Memory/Concentration: {gbcognition:22449} Insight/Judgment: {Insight:22446}  Interventions:  {Interventions for Progress Notes:23405}  DSM-5 Diagnosis(es): 307.59 (F50.8) Other Specified Feeding or Eating Disorder, Emotional Eating Behaviors  Treatment Goal & Progress: During the initial appointment with this provider, the following treatment goal was established: increase coping skills. Christianna has demonstrated progress in her goal as evidenced by increased awareness of hunger patterns and increased awareness of triggers for emotional eating. Jaclin also continues to demonstrate willingness to engage in learned skill(s).  Plan: The next appointment will be scheduled in {gbweeks:21758}, which will be via MyChart Video Visit. The next session will focus on working towards the established treatment goal.

## 2020-03-30 ENCOUNTER — Telehealth (INDEPENDENT_AMBULATORY_CARE_PROVIDER_SITE_OTHER): Payer: Commercial Managed Care - PPO | Admitting: Psychology

## 2020-03-30 ENCOUNTER — Telehealth (INDEPENDENT_AMBULATORY_CARE_PROVIDER_SITE_OTHER): Payer: Self-pay | Admitting: Psychology

## 2020-03-30 NOTE — Progress Notes (Signed)
°  Office: [phone]  /  Fax: [phone]    Date: April 13, 2020   Appointment Start Time: 3:16pm Duration: 35 minutes Provider: Glennie Isle, Psy.D. Type of Session: Individual Therapy  Location of Patient: Home Location of Provider: Provider's Home Type of Contact: Telepsychological Visit via MyChart Video Visit  Session Content: Cheyenne Hamilton is a 54 y.o. female presenting for a follow-up appointment to address the previously established treatment goal of increasing coping skills. Today's appointment was a telepsychological visit due to COVID-19. Cheyenne Hamilton provided verbal consent for today's telepsychological appointment and she is aware she is responsible for securing confidentiality on her end of the session. Prior to proceeding with today's appointment, Cheyenne Hamilton's physical location at the time of this appointment was obtained as well a phone number she could be reached at in the event of technical difficulties. Cheyenne Hamilton and this provider participated in today's telepsychological service.   This provider conducted a brief check-in. Cheyenne Hamilton shared about a recent vacation, noting she was taking Prednisone at the time. Recent eating habits were explored. It was reflected she is not consuming enough protein and is also going long periods between meals. As such, psychoeducation regarding the consequences of not eating regularly was provided. Moreover, psychoeducation regarding SMART goals was provided and Cheyenne Hamilton was engaged in goal setting to help her eat more regularly congruent to her meal plan. The following goal was established: Cheyenne Hamilton will eat lunch congruent to her meal plan at least 5 out of 7 days between now and the next appointment with this provider. Moreover, she acknowledged not drinking enough water. Thus, she was engaged in problem solving to increase water intake. Overall, Cheyenne Hamilton was receptive to today's appointment as evidenced by openness to sharing, responsiveness to feedback, and  willingness to work toward the established SMART goal.  Mental Status Examination:  Appearance: well groomed and appropriate hygiene  Behavior: appropriate to circumstances Mood: euthymic Affect: mood congruent Speech: normal in rate, volume, and tone Eye Contact: appropriate Psychomotor Activity: appropriate Gait: unable to assess Thought Process: linear, logical, and goal directed  Thought Content/Perception: no hallucinations, delusions, bizarre thinking or behavior reported or observed and no evidence of suicidal and homicidal ideation, plan, and intent Orientation: time, person, place, and purpose of appointment Memory/Concentration: memory, attention, language, and fund of knowledge intact  Insight/Judgment: fair  Interventions:  Conducted a brief chart review Provided empathic reflections and validation Employed supportive psychotherapy interventions to facilitate reduced distress and to improve coping skills with identified stressors Engaged patient in problem solving Psychoeducation provided regarding SMART goals Engaged patient in goal setting Discussed referral options  DSM-5 Diagnosis(es): 307.59 (F50.8) Other Specified Feeding or Eating Disorder, Emotional Eating Behaviors  Treatment Goal & Progress: During the initial appointment with this provider, the following treatment goal was established: increase coping skills. Cheyenne Hamilton has demonstrated progress in her goal as evidenced by increased awareness of hunger patterns and increased awareness of triggers for emotional eating. Cheyenne Hamilton also continues to demonstrate willingness to engage in learned skill(s).  Plan: The next appointment will be scheduled in one week, which will be via MyChart Video Visit. The next session will focus on working towards the established treatment goal and termination. Additionally, Cheyenne Hamilton provided verbal consent for this provider to place a referral with [facility] to establish  therapeutic services, as this provider will be out of the office for maternity leave in the coming weeks.

## 2020-03-30 NOTE — Telephone Encounter (Signed)
°  Office: [phone]  /  Fax: [phone]  Date of Call: March 30, 2020  Time of Call: 3:02pm Provider: Glennie Isle, PsyD  CONTENT: This provider called Cheyenne Hamilton to check-in as she did not present for today's MyChart Video Visit appointment at 3:00pm. A HIPAA compliant voicemail was left requesting a call back. Of note, this provider stayed on the MyChart Video Visit appointment for 5 minutes prior to signing off per the clinic's grace period policy.    PLAN: This provider will wait for Cheyenne Hamilton to call back. No further follow-up planned by this provider.

## 2020-04-03 NOTE — Progress Notes (Signed)
Chief Complaint:   OBESITY Cheyenne Hamilton is here to discuss her progress with her obesity treatment plan along with follow-up of her obesity related diagnoses. Cheyenne Hamilton is on the Category 3 Plan and keeping a food journal and adhering to recommended goals of 450-600 calories and 40 grams of protein at supper daily and states she is following her eating plan approximately 50% of the time. Cheyenne Hamilton states she is walking for 10 minutes 3 times per week.  Today's visit was #: 7 Starting weight: 290 lbs Starting date: 12/23/2019 Today's weight: 287 lbs Today's date: 03/22/2020 Total lbs lost to date: 3 Total lbs lost since last in-office visit: 0  Interim History: Cheyenne Hamilton has been traveling and has gained some weight. She feels much of her weight gain is due to fluid retention, and she is ready to look at other meal plan options.  Subjective:   1. Vitamin D deficiency Cheyenne Hamilton is on Vit D, but her level is not yet at goal. She is due for labs soon.  2. Bilateral lower extremity edema Cheyenne Hamilton notes increased bilateral lower extremity edema with recent travel. She is on her diuretic most of the time. She denies chest pain or shortness of breath at rest.  3. At risk for impaired metabolic function Cheyenne Hamilton is at increased risk for impaired metabolic function due to decrease in protein.  Assessment/Plan:   1. Vitamin D deficiency Low Vitamin D level contributes to fatigue and are associated with obesity, breast, and colon cancer. We will recheck labs at her next visit, and we will refill prescription Vitamin D for 1 month. Cheyenne Hamilton will follow-up for routine testing of Vitamin D, at least 2-3 times per year to avoid over-replacement.  - Vitamin D, Ergocalciferol, (DRISDOL) 1.25 MG (50000 UNIT) CAPS capsule; Take 1 capsule (50,000 Units total) by mouth every 7 (seven) days.  Dispense: 4 capsule; Refill: 0  2. Bilateral lower extremity edema Cheyenne Hamilton is to increase her water intake, and change to a  lower carbohydrate plan to help decrease extra cellular fluid.  3. At risk for impaired metabolic function Cheyenne Hamilton was given approximately 15 minutes of impaired  metabolic function prevention counseling today. We discussed intensive lifestyle modifications today with an emphasis on specific nutrition and exercise instructions and strategies.   Repetitive spaced learning was employed today to elicit superior memory formation and behavioral change.  4. Class 3 severe obesity with serious comorbidity and body mass index (BMI) of 50.0 to 59.9 in adult, unspecified obesity type [facility]) Cheyenne Hamilton is currently in the action stage of change. As such, her goal is to continue with weight loss efforts. She has agreed to the Category 3 Plan or following a lower carbohydrate, vegetable and lean protein rich diet plan.   Exercise goals: As is.  Behavioral modification strategies: increasing lean protein intake and increasing water intake.  Cheyenne Hamilton has agreed to follow-up with our clinic in 4 weeks. She was informed of the importance of frequent follow-up visits to maximize her success with intensive lifestyle modifications for her multiple health conditions.   Objective:   Blood pressure 127/84, pulse 86, temperature 97.9 F (36.6 C), height 5\' 3"  (1.6 m), weight 287 lb (130.2 kg), SpO2 96 %. Body mass index is 50.84 kg/m.  General: Cooperative, alert, well developed, in no acute distress. HEENT: Conjunctivae and lids unremarkable. Cardiovascular: Regular rhythm.  Lungs: Normal work of breathing. Neurologic: No focal deficits.   Lab Results  Component Value Date   CREATININE 0.81 12/23/2019  BUN 11 12/23/2019   NA 141 12/23/2019   K 4.4 12/23/2019   CL 101 12/23/2019   CO2 26 12/23/2019   Lab Results  Component Value Date   ALT 27 12/23/2019   AST 23 12/23/2019   ALKPHOS 70 12/23/2019   BILITOT 0.4 12/23/2019   Lab Results  Component Value Date   HGBA1C 6.3 (H) 12/23/2019   HGBA1C 6.4  (H) 12/16/2019   HGBA1C 6.3 (H) 12/15/2019   Lab Results  Component Value Date   INSULIN 28.6 (H) 12/23/2019   No results found for: TSH Lab Results  Component Value Date   CHOL 132 12/16/2019   HDL 31 (L) 12/16/2019   LDLCALC 49 12/16/2019   TRIG 258 (H) 12/16/2019   CHOLHDL 4.3 12/16/2019   Lab Results  Component Value Date   WBC 10.0 12/23/2019   HGB 15.5 12/23/2019   HCT 47.0 (H) 12/23/2019   MCV 97 12/23/2019   PLT 307 12/23/2019   No results found for: IRON, TIBC, FERRITIN  Attestation Statements:   Reviewed by clinician on day of visit: allergies, medications, problem list, medical history, surgical history, family history, social history, and previous encounter notes.   I, Trixie Dredge, am acting as transcriptionist for Dennard Nip, MD.  I have reviewed the above documentation for accuracy and completeness, and I agree with the above. -  Dennard Nip, MD

## 2020-04-13 ENCOUNTER — Telehealth (INDEPENDENT_AMBULATORY_CARE_PROVIDER_SITE_OTHER): Payer: Commercial Managed Care - PPO | Admitting: Psychology

## 2020-04-13 ENCOUNTER — Ambulatory Visit (INDEPENDENT_AMBULATORY_CARE_PROVIDER_SITE_OTHER): Payer: Commercial Managed Care - PPO | Admitting: Family Medicine

## 2020-04-13 DIAGNOSIS — F5089 Other specified eating disorder: Secondary | ICD-10-CM | POA: Diagnosis not present

## 2020-04-13 NOTE — Progress Notes (Signed)
  Office: [phone]  /  Fax: [phone]    Date: April 20, 2020   Appointment Start Time: 3:28pm Duration: 33 minutes Provider: Glennie Isle, Psy.D. Type of Session: Individual Therapy  Location of Patient: Home Location of Provider: Provider's Home Type of Contact: Telepsychological Visit via MyChart Video Visit  Session Content: Cheyenne Hamilton is a 54 y.o. female presenting for a follow-up appointment to address the previously established treatment goal of increasing coping skills. Today's appointment was a telepsychological visit due to COVID-19. Cheyenne Hamilton provided verbal consent for today's telepsychological appointment and she is aware she is responsible for securing confidentiality on her end of the session. Prior to proceeding with today's appointment, Cheyenne Hamilton's physical location at the time of this appointment was obtained as well a phone number she could be reached at in the event of technical difficulties. Cheyenne Hamilton and this provider participated in today's telepsychological service.   This provider conducted a brief check-in. Cheyenne Hamilton reported an increase in water intake and she lost five pounds. Additionally, she indicated she met her lunch SMART goal. Positive reinforcement was provided. She was encouraged to establish SMART goals for herself as needed; she agreed. Moreover, psychoeducation regarding making better choices and engaging in portion control during the holidays/celebrations was provided. More specifically, this provider discussed the following strategies: coming to meals hungry, but not starving; managing portion sizes; not completely depriving yourself; making the plate colorful (e.g., vegetables); pacing yourself (e.g., waiting 10 minutes before going back for seconds); taking advantage of the nutritious foods; staying hydrated; and avoid/limit bringing home leftovers. Cheyenne Hamilton was receptive to today's appointment as evidenced by openness to sharing, responsiveness to feedback,  and willingness to implement discussed strategies .  Mental Status Examination:  Appearance: well groomed and appropriate hygiene  Behavior: appropriate to circumstances Mood: euthymic Affect: mood congruent Speech: normal in rate, volume, and tone Eye Contact: appropriate Psychomotor Activity: appropriate Gait: unable to assess Thought Process: linear, logical, and goal directed  Thought Content/Perception: no hallucinations, delusions, bizarre thinking or behavior reported or observed and no evidence of suicidal and homicidal ideation, plan, and intent Orientation: time, person, place, and purpose of appointment Memory/Concentration: memory, attention, language, and fund of knowledge intact  Insight/Judgment: good  Interventions:  Conducted a brief chart review Provided empathic reflections and validation Provided positive reinforcement Employed supportive psychotherapy interventions to facilitate reduced distress and to improve coping skills with identified stressors Dicussed strategies for holidays/celebrations  DSM-5 Diagnosis(es): 307.59 (F50.8) Other Specified Feeding or Eating Disorder, Emotional Eating Behaviors  Treatment Goal & Progress: During the initial appointment with this provider, the following treatment goal was established: increase coping skills. Cheyenne Hamilton demonstrated progress in her goal as evidenced by increased awareness of hunger patterns and increased awareness of triggers for emotional eating. Cheyenne Hamilton also continues to demonstrate willingness to engage in learned skill(s).  Plan: Today was Cheyenne Hamilton's last appointment with this provider. She acknowledged understanding that she may request a follow-up appointment with this provider in the future (following this provider's maternity leave as previously discussed) as long as she is still established with the clinic. Additionally, Cheyenne Hamilton reported a plan to call [facility] back today to schedule an  initial appointment. No further follow-up planned by this provider.

## 2020-04-19 ENCOUNTER — Ambulatory Visit (INDEPENDENT_AMBULATORY_CARE_PROVIDER_SITE_OTHER): Payer: Commercial Managed Care - PPO | Admitting: Family Medicine

## 2020-04-19 ENCOUNTER — Encounter (INDEPENDENT_AMBULATORY_CARE_PROVIDER_SITE_OTHER): Payer: Self-pay | Admitting: Family Medicine

## 2020-04-19 ENCOUNTER — Other Ambulatory Visit: Payer: Self-pay

## 2020-04-19 VITALS — BP 136/83 | HR 91 | Temp 98.0°F | Ht 63.0 in | Wt 283.0 lb

## 2020-04-19 DIAGNOSIS — R7303 Prediabetes: Secondary | ICD-10-CM

## 2020-04-19 DIAGNOSIS — E559 Vitamin D deficiency, unspecified: Secondary | ICD-10-CM

## 2020-04-19 DIAGNOSIS — E7849 Other hyperlipidemia: Secondary | ICD-10-CM | POA: Diagnosis not present

## 2020-04-19 DIAGNOSIS — Z6841 Body Mass Index (BMI) 40.0 and over, adult: Secondary | ICD-10-CM

## 2020-04-19 DIAGNOSIS — Z9189 Other specified personal risk factors, not elsewhere classified: Secondary | ICD-10-CM | POA: Diagnosis not present

## 2020-04-19 MED ORDER — VITAMIN D (ERGOCALCIFEROL) 1.25 MG (50000 UNIT) PO CAPS: 50000.0000 [IU] | ORAL_CAPSULE | ORAL | 0 refills | Status: DC

## 2020-04-19 NOTE — Progress Notes (Signed)
Chief Complaint:   OBESITY Cheyenne Hamilton is here to discuss her progress with her obesity treatment plan along with follow-up of her obesity related diagnoses. Cheyenne Hamilton is on the Category 3 Plan or following a lower carbohydrate, vegetable and lean protein rich diet plan and states she is following her eating plan approximately 40% of the time. Cheyenne Hamilton states she is doing 0 minutes 0 times per week.  Today's visit was #: 8 Starting weight: 290 lbs Starting date: 12/23/2019 Today's weight: 283 lbs Today's date: 04/19/2020 Total lbs lost to date: 7 Total lbs lost since last in-office visit: 4  Interim History: Cheyenne Hamilton is down 4 lbs today. She is working on increasing her water to 40 oz daily. She does eat out about once per week.   Subjective:   1. Vitamin D deficiency Cheyenne Hamilton's last Vit D level was low at 27.1. She is on weekly prescription Vit D and she notes fatigue.  2. Pre-diabetes Cheyenne Hamilton's last A1c was 6.3. She is taking metformin XR 500 mg daily. She continues to work on diet and exercise to decrease her risk of diabetes. She denies nausea or hypoglycemia.  Lab Results  Component Value Date   HGBA1C 6.3 (H) 12/23/2019   Lab Results  Component Value Date   INSULIN 28.6 (H) 12/23/2019   3. Other hyperlipidemia Cheyenne Hamilton's last LDL was at goal at 49, and triglycerides were elevated at 258. Her HDL was low at 31. She is on Crestor 5 mg.   Lab Results  Component Value Date   ALT 27 12/23/2019   AST 23 12/23/2019   ALKPHOS 70 12/23/2019   BILITOT 0.4 12/23/2019   Lab Results  Component Value Date   CHOL 132 12/16/2019   HDL 31 (L) 12/16/2019   LDLCALC 49 12/16/2019   TRIG 258 (H) 12/16/2019   CHOLHDL 4.3 12/16/2019   4. At risk for diabetes mellitus [place] is at higher than average risk for developing diabetes due to obesity and pre-diabetes.   Assessment/Plan:   1. Vitamin D deficiency  We will check labs today, and we will refill prescription Vitamin D for 1  month.  - VITAMIN D 25 Hydroxy (Vit-D Deficiency, Fractures) - Vitamin D, Ergocalciferol, (DRISDOL) 1.25 MG (50000 UNIT) CAPS capsule; Take 1 capsule (50,000 Units total) by mouth every 7 (seven) days.  Dispense: 4 capsule; Refill: 0  2. Pre-diabetes  We will check labs today. She will continue metformin.  - Comprehensive metabolic panel - Hemoglobin A1c - Insulin, random  3. Other hyperlipidemia  We will check labs today. She will continue Crestor.  - Lipid Panel With LDL/HDL Ratio  4. At risk for diabetes mellitus Cheyenne Hamilton was given approximately 15 minutes of diabetes education and counseling today. We discussed intensive lifestyle modifications today with an emphasis on weight loss as well as increasing exercise and decreasing simple carbohydrates in her diet. We also reviewed medication options with an emphasis on risk versus benefit of those discussed.   Repetitive spaced learning was employed today to elicit superior memory formation and behavioral change.  5. Class 3 severe obesity with serious comorbidity and body mass index (BMI) of 50.0 to 59.9 in adult, unspecified obesity type [facility]) Cheyenne Hamilton is currently in the action stage of change. As such, her goal is to continue with weight loss efforts. She has agreed to the Category 3 Plan or following a lower carbohydrate, vegetable and lean protein rich diet plan.   We discussed the low carbohydrate plan at length and she  will try it. Recipes were provided as well, and Eating Out handout.  Exercise goals: No exercise has been prescribed at this time.  Behavioral modification strategies: increasing lean protein intake and meal planning and cooking strategies.  Cheyenne Hamilton has agreed to follow-up with our clinic in 4 weeks with Dr. Leafy Ro.  Cheyenne Hamilton was informed we would discuss her lab results at her next visit unless there is a critical issue that needs to be addressed sooner. Cheyenne Hamilton agreed to keep her next visit at the agreed upon  time to discuss these results.  Objective:   Blood pressure 136/83, pulse 91, temperature 98 F (36.7 C), height 5\' 3"  (1.6 m), weight 283 lb (128.4 kg), SpO2 96 %. Body mass index is 50.13 kg/m.  General: Cooperative, alert, well developed, in no acute distress. HEENT: Conjunctivae and lids unremarkable. Cardiovascular: Regular rhythm.  Lungs: Normal work of breathing. Neurologic: No focal deficits.   Lab Results  Component Value Date   CREATININE 0.81 12/23/2019   BUN 11 12/23/2019   NA 141 12/23/2019   K 4.4 12/23/2019   CL 101 12/23/2019   CO2 26 12/23/2019   Lab Results  Component Value Date   ALT 27 12/23/2019   AST 23 12/23/2019   ALKPHOS 70 12/23/2019   BILITOT 0.4 12/23/2019   Lab Results  Component Value Date   HGBA1C 6.3 (H) 12/23/2019   HGBA1C 6.4 (H) 12/16/2019   HGBA1C 6.3 (H) 12/15/2019   Lab Results  Component Value Date   INSULIN 28.6 (H) 12/23/2019   No results found for: TSH Lab Results  Component Value Date   CHOL 132 12/16/2019   HDL 31 (L) 12/16/2019   LDLCALC 49 12/16/2019   TRIG 258 (H) 12/16/2019   CHOLHDL 4.3 12/16/2019   Lab Results  Component Value Date   WBC 10.0 12/23/2019   HGB 15.5 12/23/2019   HCT 47.0 (H) 12/23/2019   MCV 97 12/23/2019   PLT 307 12/23/2019   No results found for: IRON, TIBC, FERRITIN  Attestation Statements:   Reviewed by clinician on day of visit: allergies, medications, problem list, medical history, surgical history, family history, social history, and previous encounter notes.   Wilhemena Durie, am acting as Location manager for [employer], FNP-C.  I have reviewed the above documentation for accuracy and completeness, and I agree with the above. -  Georgianne Fick, FNP

## 2020-04-20 ENCOUNTER — Other Ambulatory Visit: Payer: Self-pay | Admitting: Sports Medicine

## 2020-04-20 ENCOUNTER — Telehealth (INDEPENDENT_AMBULATORY_CARE_PROVIDER_SITE_OTHER): Payer: Commercial Managed Care - PPO | Admitting: Psychology

## 2020-04-20 ENCOUNTER — Other Ambulatory Visit (HOSPITAL_COMMUNITY): Payer: Self-pay | Admitting: Sports Medicine

## 2020-04-20 ENCOUNTER — Encounter (INDEPENDENT_AMBULATORY_CARE_PROVIDER_SITE_OTHER): Payer: Self-pay | Admitting: Family Medicine

## 2020-04-20 DIAGNOSIS — F5089 Other specified eating disorder: Secondary | ICD-10-CM

## 2020-04-20 DIAGNOSIS — E7849 Other hyperlipidemia: Secondary | ICD-10-CM | POA: Insufficient documentation

## 2020-04-20 DIAGNOSIS — M25572 Pain in left ankle and joints of left foot: Secondary | ICD-10-CM

## 2020-04-20 LAB — VITAMIN D 25 HYDROXY (VIT D DEFICIENCY, FRACTURES): Vit D, 25-Hydroxy: 38.7 ng/mL (ref 30.0–100.0)

## 2020-04-20 LAB — COMPREHENSIVE METABOLIC PANEL
ALT: 20 IU/L (ref 0–32)
AST: 19 IU/L (ref 0–40)
Albumin/Globulin Ratio: 1.6 (ref 1.2–2.2)
Albumin: 4.1 g/dL (ref 3.8–4.9)
Alkaline Phosphatase: 77 IU/L (ref 44–121)
BUN/Creatinine Ratio: 16 (ref 9–23)
BUN: 12 mg/dL (ref 6–24)
Bilirubin Total: 0.3 mg/dL (ref 0.0–1.2)
CO2: 27 mmol/L (ref 20–29)
Calcium: 9.5 mg/dL (ref 8.7–10.2)
Chloride: 99 mmol/L (ref 96–106)
Creatinine, Ser: 0.75 mg/dL (ref 0.57–1.00)
GFR calc Af Amer: 105 mL/min/{1.73_m2} (ref >59)
GFR calc non Af Amer: 91 mL/min/{1.73_m2} (ref >59)
Globulin, Total: 2.5 g/dL (ref 1.5–4.5)
Glucose: 103 mg/dL — ABNORMAL HIGH (ref 65–99)
Potassium: 4.2 mmol/L (ref 3.5–5.2)
Sodium: 140 mmol/L (ref 134–144)
Total Protein: 6.6 g/dL (ref 6.0–8.5)

## 2020-04-20 LAB — LIPID PANEL WITH LDL/HDL RATIO
Cholesterol, Total: 132 mg/dL (ref 100–199)
HDL: 32 mg/dL — ABNORMAL LOW (ref >39)
LDL Chol Calc (NIH): 62 mg/dL (ref 0–99)
LDL/HDL Ratio: 1.9 ratio (ref 0.0–3.2)
Triglycerides: 233 mg/dL — ABNORMAL HIGH (ref 0–149)
VLDL Cholesterol Cal: 38 mg/dL (ref 5–40)

## 2020-04-20 LAB — HEMOGLOBIN A1C
Est. average glucose Bld gHb Est-mCnc: 134 mg/dL
Hgb A1c MFr Bld: 6.3 % — ABNORMAL HIGH (ref 4.8–5.6)

## 2020-04-20 LAB — INSULIN, RANDOM: INSULIN: 38.1 u[IU]/mL — ABNORMAL HIGH (ref 2.6–24.9)

## 2020-04-27 ENCOUNTER — Ambulatory Visit (INDEPENDENT_AMBULATORY_CARE_PROVIDER_SITE_OTHER): Payer: Commercial Managed Care - PPO | Admitting: Family Medicine

## 2020-05-03 ENCOUNTER — Other Ambulatory Visit: Payer: Self-pay

## 2020-05-03 ENCOUNTER — Ambulatory Visit (HOSPITAL_COMMUNITY)
Admission: RE | Admit: 2020-05-03 | Discharge: 2020-05-03 | Disposition: A | Discharge status: Home / Self Care | Payer: Commercial Managed Care - PPO | Source: Ambulatory Visit | Attending: Sports Medicine | Admitting: Sports Medicine

## 2020-05-03 DIAGNOSIS — M25572 Pain in left ankle and joints of left foot: Secondary | ICD-10-CM | POA: Diagnosis present

## 2020-05-08 ENCOUNTER — Other Ambulatory Visit (HOSPITAL_COMMUNITY): Payer: Self-pay | Admitting: Chiropractic Medicine

## 2020-05-08 ENCOUNTER — Other Ambulatory Visit: Payer: Self-pay | Admitting: Chiropractic Medicine

## 2020-05-08 DIAGNOSIS — M545 Low back pain, unspecified: Secondary | ICD-10-CM

## 2020-05-17 ENCOUNTER — Ambulatory Visit (INDEPENDENT_AMBULATORY_CARE_PROVIDER_SITE_OTHER): Payer: Commercial Managed Care - PPO | Admitting: Professional

## 2020-05-17 DIAGNOSIS — F329 Major depressive disorder, single episode, unspecified: Secondary | ICD-10-CM | POA: Diagnosis not present

## 2020-05-18 ENCOUNTER — Ambulatory Visit (INDEPENDENT_AMBULATORY_CARE_PROVIDER_SITE_OTHER): Payer: Commercial Managed Care - PPO | Admitting: Family Medicine

## 2020-05-18 ENCOUNTER — Ambulatory Visit (HOSPITAL_COMMUNITY): Payer: Commercial Managed Care - PPO

## 2020-05-18 ENCOUNTER — Encounter (HOSPITAL_COMMUNITY): Payer: Self-pay

## 2020-05-25 ENCOUNTER — Ambulatory Visit (INDEPENDENT_AMBULATORY_CARE_PROVIDER_SITE_OTHER): Payer: Commercial Managed Care - PPO | Admitting: Professional

## 2020-05-25 DIAGNOSIS — F329 Major depressive disorder, single episode, unspecified: Secondary | ICD-10-CM

## 2020-05-26 ENCOUNTER — Encounter (INDEPENDENT_AMBULATORY_CARE_PROVIDER_SITE_OTHER): Payer: Self-pay | Admitting: Family Medicine

## 2020-05-26 ENCOUNTER — Ambulatory Visit (HOSPITAL_COMMUNITY)
Admission: RE | Admit: 2020-05-26 | Discharge: 2020-05-26 | Disposition: A | Discharge status: Home / Self Care | Payer: Commercial Managed Care - PPO | Source: Ambulatory Visit | Attending: Chiropractic Medicine | Admitting: Chiropractic Medicine

## 2020-05-26 ENCOUNTER — Other Ambulatory Visit: Payer: Self-pay

## 2020-05-26 DIAGNOSIS — M545 Low back pain, unspecified: Secondary | ICD-10-CM | POA: Diagnosis present

## 2020-05-28 ENCOUNTER — Other Ambulatory Visit (INDEPENDENT_AMBULATORY_CARE_PROVIDER_SITE_OTHER): Payer: Self-pay | Admitting: Family Medicine

## 2020-05-28 DIAGNOSIS — E559 Vitamin D deficiency, unspecified: Secondary | ICD-10-CM

## 2020-05-29 MED ORDER — VITAMIN D (ERGOCALCIFEROL) 1.25 MG (50000 UNIT) PO CAPS: 50000.0000 [IU] | ORAL_CAPSULE | ORAL | 0 refills | Status: DC

## 2020-05-29 NOTE — Telephone Encounter (Signed)
This patient was last seen by Jake Bathe, FNP and currently has an upcoming appt scheduled on 06/22/20 with Dr. Leafy Ro.

## 2020-05-29 NOTE — Telephone Encounter (Signed)
Refill request

## 2020-05-31 ENCOUNTER — Ambulatory Visit (INDEPENDENT_AMBULATORY_CARE_PROVIDER_SITE_OTHER): Payer: Commercial Managed Care - PPO | Admitting: Family Medicine

## 2020-06-01 ENCOUNTER — Ambulatory Visit (INDEPENDENT_AMBULATORY_CARE_PROVIDER_SITE_OTHER): Payer: Commercial Managed Care - PPO | Admitting: Family Medicine

## 2020-06-09 ENCOUNTER — Ambulatory Visit (INDEPENDENT_AMBULATORY_CARE_PROVIDER_SITE_OTHER): Payer: Commercial Managed Care - PPO | Admitting: Professional

## 2020-06-09 ENCOUNTER — Ambulatory Visit: Payer: Commercial Managed Care - PPO | Admitting: Professional

## 2020-06-09 DIAGNOSIS — F329 Major depressive disorder, single episode, unspecified: Secondary | ICD-10-CM | POA: Diagnosis not present

## 2020-06-16 ENCOUNTER — Encounter (INDEPENDENT_AMBULATORY_CARE_PROVIDER_SITE_OTHER): Payer: Self-pay | Admitting: Family Medicine

## 2020-06-22 ENCOUNTER — Ambulatory Visit (INDEPENDENT_AMBULATORY_CARE_PROVIDER_SITE_OTHER): Payer: Commercial Managed Care - PPO | Admitting: Family Medicine

## 2020-06-23 ENCOUNTER — Ambulatory Visit: Payer: Commercial Managed Care - PPO | Admitting: Professional

## 2020-09-28 ENCOUNTER — Ambulatory Visit: Payer: Commercial Managed Care - PPO | Admitting: Neurology

## 2021-03-06 ENCOUNTER — Encounter: Payer: Self-pay | Admitting: Internal Medicine

## 2021-03-21 ENCOUNTER — Telehealth: Payer: Self-pay | Admitting: Internal Medicine

## 2021-03-21 ENCOUNTER — Other Ambulatory Visit: Payer: Self-pay

## 2021-03-21 ENCOUNTER — Encounter: Payer: Self-pay | Admitting: Gastroenterology

## 2021-03-21 ENCOUNTER — Telehealth: Payer: Self-pay | Admitting: *Deleted

## 2021-03-21 ENCOUNTER — Ambulatory Visit: Payer: Commercial Managed Care - PPO | Admitting: Gastroenterology

## 2021-03-21 VITALS — BP 135/80 | HR 81 | Temp 97.5°F | Ht 64.0 in | Wt 289.4 lb

## 2021-03-21 DIAGNOSIS — R195 Other fecal abnormalities: Secondary | ICD-10-CM

## 2021-03-21 DIAGNOSIS — R1010 Upper abdominal pain, unspecified: Secondary | ICD-10-CM | POA: Diagnosis not present

## 2021-03-21 DIAGNOSIS — R197 Diarrhea, unspecified: Secondary | ICD-10-CM

## 2021-03-21 DIAGNOSIS — Z86010 Personal history of colonic polyps: Secondary | ICD-10-CM | POA: Diagnosis not present

## 2021-03-21 DIAGNOSIS — R109 Unspecified abdominal pain: Secondary | ICD-10-CM | POA: Insufficient documentation

## 2021-03-21 DIAGNOSIS — K219 Gastro-esophageal reflux disease without esophagitis: Secondary | ICD-10-CM

## 2021-03-21 MED ORDER — CLENPIQ 10-3.5-12 MG-GM -GM/160ML PO SOLN: 1.0000 | Freq: Once | ORAL | 0 refills | Status: AC

## 2021-03-21 NOTE — H&P (View-Only) (Signed)
    Primary Care Physician: Worley, Miranda, PA  Primary Gastroenterologist:  Michael Rourk, MD   Chief Complaint  Patient presents with   Diarrhea    Everyday since gallbladder come out    HPI: Cheyenne Hamilton is a 55 y.o. female here for consideration of colonoscopy at the request of her PCP, Miranda Worley.   Patient's last colonoscopy in November 2018.  She had Five 5 to 6 mm polyps in the transverse colon and in the mid transverse colon, removed, tubular adenomas.  Random colon biopsies were negative. Advised 5 year surveillance colonoscopy.    Patient has had chronic diarrhea since her gallbladder was removed in 1987.  Initially she was told she had IBS.  Remotely was diagnosed with H. pylori, treated.  States she had a EGD and colonoscopy at age 40, I do not have those records.  Several years ago she tried Questran for possible bile salt diarrhea.  She states that she only took for a week or 2 and cannot stand the texture and taste.  She does not recall it being very helpful.  Over the past 3 to 4 years, her diarrhea has progressed.  She could have up towards to 12 stools per day.  She has had accidents from not making it to the restroom in time.  She has a significant postprandial component often having to have a BM 2-3 times while eating a meal.  In July she developed worsening of her chronic diarrhea.  Symptoms associated with abdominal pain, sulfur belches.  Stool studies positive for toxigenic C. difficile.  She was treated with antibiotics and did improve.  Denies any antibiotic therapy prior to onset of symptoms.  Approximately 4 weeks later she had recurrence of symptoms.  She had repeat stool test which were negative for C. difficile.  Stool was heme positive.  She was tested for H. pylori via stool antigen although she did not stop PPI therapy prior to testing.  It was negative.  Symptoms lasted for 2 days this time and went away.  She believes her heme positive stools  related to perianal irritation.  She is quite concerned about her symptoms.  Her heartburn has been controlled but she has had frequent belching.  States her bowels are back to her baseline diarrhea.  2 months ago she did start Lomotil and takes 1-2 daily and now having 4 bowel movements per day.  Stools more consistency than her usual loose stools but still are very soft and not formed.  No melena or rectal bleeding.  Intentional weight loss of 12 pounds since starting Ozempic.     Current Outpatient Medications  Medication Sig Dispense Refill   aspirin EC 81 MG tablet Take 81 mg by mouth daily. Swallow whole.     clobetasol cream (TEMOVATE) 0.05 % Apply 1 application topically 2 (two) times daily as needed.     clonazePAM (KLONOPIN) 0.5 MG tablet Take 1 mg by mouth at bedtime as needed for anxiety.      diclofenac Sodium (VOLTAREN) 1 % GEL Apply 2 g topically 4 (four) times daily as needed.     diphenoxylate-atropine (LOMOTIL) 2.5-0.025 MG tablet Take 1 tablet by mouth 4 (four) times daily as needed for diarrhea or loose stools.     doxycycline (VIBRAMYCIN) 100 MG capsule Take 100 mg by mouth daily as needed (skin irritation).      escitalopram (LEXAPRO) 10 MG tablet Take 10 mg by mouth every morning.        folic acid (FOLVITE) 1 MG tablet Take 1 mg by mouth every morning.      furosemide (LASIX) 20 MG tablet Take 20 mg by mouth every morning.      methotrexate (RHEUMATREX) 2.5 MG tablet Take 15 mg by mouth every Monday. 6 pills every monday      omeprazole (PRILOSEC) 20 MG capsule Take 20 mg by mouth every morning.      rosuvastatin (CRESTOR) 5 MG tablet Take 5 mg by mouth every morning.      Semaglutide,0.25 or 0.5MG/DOS, (OZEMPIC, 0.25 OR 0.5 MG/DOSE,) 2 MG/1.5ML SOPN Inject 0.5 mg into the skin once a week. Every Sunday     Vitamin D, Ergocalciferol, (DRISDOL) 1.25 MG (50000 UNIT) CAPS capsule Take 1 capsule (50,000 Units total) by mouth every 7 (seven) days. 4 capsule 0   No current  facility-administered medications for this visit.    Allergies as of 03/21/2021 - Review Complete 03/21/2021  Allergen Reaction Noted   Hydrocodone Other (See Comments) 03/30/2012   Past Medical History:  Diagnosis Date   Acid reflux    Anxiety    Back pain    Complication of anesthesia    Hard to wake up   Depression    GERD (gastroesophageal reflux disease)    High triglycerides    Hyperlipidemia    IBS (irritable bowel syndrome)    Knee pain    Lower extremity edema    Ocular migraine    Palpitations    PCOS (polycystic ovarian syndrome)    Prediabetes    Psoriasis    Sleep apnea    Past Surgical History:  Procedure Laterality Date   BIOPSY  04/18/2017   Procedure: BIOPSY;  Surgeon: Rourk, Robert M, MD;  [facility];  Service: Endoscopy;;  ascending/descending/sigmoid   CERVICAL CERCLAGE     CHOLECYSTECTOMY     COLONOSCOPY WITH PROPOFOL N/A 04/18/2017   Procedure: COLONOSCOPY WITH PROPOFOL;  Surgeon: Rourk, Robert M, MD;  [facility]: [facility];  Service: Endoscopy;  Laterality: N/A;  7:30am   POLYPECTOMY  04/18/2017   Procedure: POLYPECTOMY;  Surgeon: Rourk, Robert M, MD;  [facility];  Service: Endoscopy;;  transverse colon   Family History  Problem Relation Age of Onset   Arthritis Other    Hypertension Mother    Thyroid disease Mother    Obesity Mother    Anxiety disorder Mother    Hypertension Father    Obesity Father    Social History   Tobacco Use   Smoking status: Every Day    Packs/day: 0.75    Years: 30.00    Pack years: 22.50    Types: Cigarettes   Smokeless tobacco: Never   Tobacco comments:    smoked last at 6:00am  Substance Use Topics   Alcohol use: No   Drug use: No    ROS:  General: Negative for anorexia, unintentional weight loss, fever, chills, fatigue, weakness. ENT: Negative for hoarseness, difficulty swallowing , nasal congestion. CV: Negative for chest pain, angina, palpitations, dyspnea on exertion,  peripheral edema.  Respiratory: Negative for dyspnea at rest, dyspnea on exertion, cough, sputum, wheezing.  GI: See history of present illness. GU:  Negative for dysuria, hematuria, urinary incontinence, urinary frequency, nocturnal urination.  Endo: Negative for unusual weight change.    Physical Examination:   BP 135/80   Pulse 81   Temp (!) 97.5 F (36.4 C)   Ht 5' 4" (1.626 m)   Wt 289 lb 6.4   oz (131.3 kg)   BMI 49.68 kg/m   General: Well-nourished, well-developed in no acute distress.  Eyes: No icterus. Mouth: masked. Lungs: Clear to auscultation bilaterally.  Heart: Regular rate and rhythm, no murmurs rubs or gallops.  Abdomen: Bowel sounds are normal, nontender, nondistended, no hepatosplenomegaly or masses, no abdominal bruits or hernia , no rebound or guarding.  Limited due to body habitus Extremities: No lower extremity edema. No clubbing or deformities. Neuro: Alert and oriented x 4   Skin: Warm and dry, no jaundice.   Psych: Alert and cooperative, normal mood and affect.  Labs:  Labs from July 2022: BUN 10, creatinine 0.83, total bilirubin 0.4, alkaline phosphatase 78, AST 16, ALT 19, hemoglobin 15.5, hematocrit 45.5, positive for toxigenic C. difficile.  GI pathogen panel was negative.  Labs from September 2022: Heme positive stool, C. difficile negative.  H. pylori stool antigen negative (patient did not stop PPI therapy for testing), stool culture negative.  Imaging Studies: No results found.   Assessment: Very pleasant 55-year-old female with chronic diarrhea since time of cholecystectomy in the 80s.  Symptoms have been progressive over the past 3 to 4 years, now associated with accidents.  Symptoms controlling her life.  Significant postprandial component, often cannot complete a meal.  Colonoscopy in 2018 with random colon biopsies was negative.  She had several tubular adenomas removed and was due for colonoscopy next year.  Suspect significant bile acid  diarrhea.  Did not tolerate Questran in the past but may find Colestid or pancreatic enzymes beneficial (EPI).  We will screen for celiac disease, alpha gal.  Recently developed acute on chronic diarrhea in July.  Tested positive for C. difficile, improved with antibiotic therapy but had recurrence of symptoms 6 weeks later which were short-lived.  Currently having 4 bowel movements a day with Lomotil.  Feels like she is back at her baseline chronic diarrhea.  Stool was heme positive during episode of increased diarrhea.  Possibly related to benign anorectal source, perianal irritation but given history of colon polyps, would be reasonable to pursue colonoscopy at this time.  She also has chronic GERD for 15 years.  Complains of increased belching.  Recommend screening for Barrett's esophagus, evaluate for hiatal hernia.   EGD/TCS Plan: Colonoscopy/EGD with propofol. ASA III.  I have discussed the risks, alternatives, benefits with regards to but not limited to the risk of reaction to medication, bleeding, infection, perforation and the patient is agreeable to proceed. Written consent to be obtained. If needed, she is agreeable Dr. Carver to complete procedure in Dr. Rourk's absence. Consider testing for H.pylori and random colon biopsies.  She will hold Ozempic for 7 days prior to procedure. Labs to include c-Met, CBC, celiac serologies, alpha gal. Will call with any recurrent acute on chronic diarrhea.   Addendum: absolute EOS elevated, consider gastric and colon biopsies to rule out eosinophilic gastritis/colitis.  

## 2021-03-21 NOTE — Patient Instructions (Signed)
Please go for labs at [facility]. We will be in touch with results as available.  Upper endoscopy and colonoscopy to be scheduled. See separate instructions.  Call if our have recurrent CDiff type symptoms.

## 2021-03-21 NOTE — Telephone Encounter (Signed)
Pt returning call. [phone]

## 2021-03-21 NOTE — Progress Notes (Addendum)
Primary Care Physician: Denny Levy, [place]  Primary Gastroenterologist:  Garfield Cornea, MD   Chief Complaint  Patient presents with   Diarrhea    Everyday since gallbladder come out    HPI: Cheyenne Hamilton is a 55 y.o. female here for consideration of colonoscopy at the request of her PCP, Denny Levy.   Patient's last colonoscopy in November 2018.  She had Five 5 to 6 mm polyps in the transverse colon and in the mid transverse colon, removed, tubular adenomas.  Random colon biopsies were negative. Advised 5 year surveillance colonoscopy.    Patient has had chronic diarrhea since her gallbladder was removed in 1987.  Initially she was told she had IBS.  Remotely was diagnosed with H. pylori, treated.  States she had a EGD and colonoscopy at age 43, I do not have those records.  Several years ago she tried Questran for possible bile salt diarrhea.  She states that she only took for a week or 2 and cannot stand the texture and taste.  She does not recall it being very helpful.  Over the past 3 to 4 years, her diarrhea has progressed.  She could have up towards to 12 stools per day.  She has had accidents from not making it to the restroom in time.  She has a significant postprandial component often having to have a BM 2-3 times while eating a meal.  In July she developed worsening of her chronic diarrhea.  Symptoms associated with abdominal pain, sulfur belches.  Stool studies positive for toxigenic C. difficile.  She was treated with antibiotics and did improve.  Denies any antibiotic therapy prior to onset of symptoms.  Approximately 4 weeks later she had recurrence of symptoms.  She had repeat stool test which were negative for C. difficile.  Stool was heme positive.  She was tested for H. pylori via stool antigen although she did not stop PPI therapy prior to testing.  It was negative.  Symptoms lasted for 2 days this time and went away.  She believes her heme positive stools  related to perianal irritation.  She is quite concerned about her symptoms.  Her heartburn has been controlled but she has had frequent belching.  States her bowels are back to her baseline diarrhea.  2 months ago she did start Lomotil and takes 1-2 daily and now having 4 bowel movements per day.  Stools more consistency than her usual loose stools but still are very soft and not formed.  No melena or rectal bleeding.  Intentional weight loss of 12 pounds since starting Ozempic.     Current Outpatient Medications  Medication Sig Dispense Refill   aspirin EC 81 MG tablet Take 81 mg by mouth daily. Swallow whole.     clobetasol cream (TEMOVATE) 4.12 % Apply 1 application topically 2 (two) times daily as needed.     clonazePAM (KLONOPIN) 0.5 MG tablet Take 1 mg by mouth at bedtime as needed for anxiety.      diclofenac Sodium (VOLTAREN) 1 % GEL Apply 2 g topically 4 (four) times daily as needed.     diphenoxylate-atropine (LOMOTIL) 2.5-0.025 MG tablet Take 1 tablet by mouth 4 (four) times daily as needed for diarrhea or loose stools.     doxycycline (VIBRAMYCIN) 100 MG capsule Take 100 mg by mouth daily as needed (skin irritation).      escitalopram (LEXAPRO) 10 MG tablet Take 10 mg by mouth every morning.  folic acid (FOLVITE) 1 MG tablet Take 1 mg by mouth every morning.      furosemide (LASIX) 20 MG tablet Take 20 mg by mouth every morning.      methotrexate (RHEUMATREX) 2.5 MG tablet Take 15 mg by mouth every Monday. 6 pills every monday      omeprazole (PRILOSEC) 20 MG capsule Take 20 mg by mouth every morning.      rosuvastatin (CRESTOR) 5 MG tablet Take 5 mg by mouth every morning.      Semaglutide,0.25 or 0.5MG/DOS, (OZEMPIC, 0.25 OR 0.5 MG/DOSE,) 2 MG/1.5ML SOPN Inject 0.5 mg into the skin once a week. Every Sunday     Vitamin D, Ergocalciferol, (DRISDOL) 1.25 MG (50000 UNIT) CAPS capsule Take 1 capsule (50,000 Units total) by mouth every 7 (seven) days. 4 capsule 0   No current  facility-administered medications for this visit.    Allergies as of 03/21/2021 - Review Complete 03/21/2021  Allergen Reaction Noted   Hydrocodone Other (See Comments) 03/30/2012   Past Medical History:  Diagnosis Date   Acid reflux    Anxiety    Back pain    Complication of anesthesia    Hard to wake up   Depression    GERD (gastroesophageal reflux disease)    High triglycerides    Hyperlipidemia    IBS (irritable bowel syndrome)    Knee pain    Lower extremity edema    Ocular migraine    Palpitations    PCOS (polycystic ovarian syndrome)    Prediabetes    Psoriasis    Sleep apnea    Past Surgical History:  Procedure Laterality Date   BIOPSY  04/18/2017   Procedure: BIOPSY;  Surgeon: Daneil Dolin, MD;  [facility];  Service: Endoscopy;;  ascending/descending/sigmoid   CERVICAL CERCLAGE     CHOLECYSTECTOMY     COLONOSCOPY WITH PROPOFOL N/A 04/18/2017   Procedure: COLONOSCOPY WITH PROPOFOL;  Surgeon: Daneil Dolin, MD;  [facility];  Service: Endoscopy;  Laterality: N/A;  7:30am   POLYPECTOMY  04/18/2017   Procedure: POLYPECTOMY;  Surgeon: Daneil Dolin, MD;  [facility];  Service: Endoscopy;;  transverse colon   Family History  Problem Relation Age of Onset   Arthritis Other    Hypertension Mother    Thyroid disease Mother    Obesity Mother    Anxiety disorder Mother    Hypertension Father    Obesity Father    Social History   Tobacco Use   Smoking status: Every Day    Packs/day: 0.75    Years: 30.00    Pack years: 22.50    Types: Cigarettes   Smokeless tobacco: Never   Tobacco comments:    smoked last at 6:00am  Substance Use Topics   Alcohol use: No   Drug use: No    ROS:  General: Negative for anorexia, unintentional weight loss, fever, chills, fatigue, weakness. ENT: Negative for hoarseness, difficulty swallowing , nasal congestion. CV: Negative for chest pain, angina, palpitations, dyspnea on exertion,  peripheral edema.  Respiratory: Negative for dyspnea at rest, dyspnea on exertion, cough, sputum, wheezing.  GI: See history of present illness. GU:  Negative for dysuria, hematuria, urinary incontinence, urinary frequency, nocturnal urination.  Endo: Negative for unusual weight change.    Physical Examination:   BP 135/80   Pulse 81   Temp (!) 97.5 F (36.4 C)   Ht 5' 4" (1.626 m)   Wt 289 lb 6.4  oz (131.3 kg)   BMI 49.68 kg/m   General: Well-nourished, well-developed in no acute distress.  Eyes: No icterus. Mouth: masked. Lungs: Clear to auscultation bilaterally.  Heart: Regular rate and rhythm, no murmurs rubs or gallops.  Abdomen: Bowel sounds are normal, nontender, nondistended, no hepatosplenomegaly or masses, no abdominal bruits or hernia , no rebound or guarding.  Limited due to body habitus Extremities: No lower extremity edema. No clubbing or deformities. Neuro: Alert and oriented x 4   Skin: Warm and dry, no jaundice.   Psych: Alert and cooperative, normal mood and affect.  Labs:  Labs from July 2022: BUN 10, creatinine 0.83, total bilirubin 0.4, alkaline phosphatase 78, AST 16, ALT 19, hemoglobin 15.5, hematocrit 45.5, positive for toxigenic C. difficile.  GI pathogen panel was negative.  Labs from September 2022: Heme positive stool, C. difficile negative.  H. pylori stool antigen negative (patient did not stop PPI therapy for testing), stool culture negative.  Imaging Studies: No results found.   Assessment: Very pleasant 55 year old female with chronic diarrhea since time of cholecystectomy in the 71s.  Symptoms have been progressive over the past 3 to 4 years, now associated with accidents.  Symptoms controlling her life.  Significant postprandial component, often cannot complete a meal.  Colonoscopy in 2018 with random colon biopsies was negative.  She had several tubular adenomas removed and was due for colonoscopy next year.  Suspect significant bile acid  diarrhea.  Did not tolerate Questran in the past but may find Colestid or pancreatic enzymes beneficial (EPI).  We will screen for celiac disease, alpha gal.  Recently developed acute on chronic diarrhea in July.  Tested positive for C. difficile, improved with antibiotic therapy but had recurrence of symptoms 6 weeks later which were short-lived.  Currently having 4 bowel movements a day with Lomotil.  Feels like she is back at her baseline chronic diarrhea.  Stool was heme positive during episode of increased diarrhea.  Possibly related to benign anorectal source, perianal irritation but given history of colon polyps, would be reasonable to pursue colonoscopy at this time.  She also has chronic GERD for 15 years.  Complains of increased belching.  Recommend screening for Barrett's esophagus, evaluate for hiatal hernia.   EGD/TCS Plan: Colonoscopy/EGD with propofol. ASA III.  I have discussed the risks, alternatives, benefits with regards to but not limited to the risk of reaction to medication, bleeding, infection, perforation and the patient is agreeable to proceed. Written consent to be obtained. If needed, she is agreeable Dr. Abbey Chatters to complete procedure in Dr. Roseanne Kaufman absence. Consider testing for H.pylori and random colon biopsies.  She will hold Ozempic for 7 days prior to procedure. Labs to include c-Met, CBC, celiac serologies, alpha gal. Will call with any recurrent acute on chronic diarrhea.   Addendum: absolute EOS elevated, consider gastric and colon biopsies to rule out eosinophilic gastritis/colitis.

## 2021-03-21 NOTE — Telephone Encounter (Signed)
Called pt. She has been scheduled for 11/10 at 9:15am. Aware will mail prep instructions with pre-op appt. Wants clenpiq per encounter form. Will send in rx. Also aware to hold ozempic 1 week prior and lomotil 5 days prior.  PA submitted via [employer]. Case ID# [id number]

## 2021-03-21 NOTE — Telephone Encounter (Signed)
LMOVM to call back to schedule TCS/EGD with propofol asa 3 Dr. Gala Romney

## 2021-03-26 ENCOUNTER — Telehealth: Payer: Self-pay | Admitting: Internal Medicine

## 2021-03-26 LAB — CBC WITH DIFFERENTIAL/PLATELET
Absolute Monocytes: 592 cells/uL (ref 200–950)
Basophils Absolute: 41 cells/uL (ref 0–200)
Basophils Relative: 0.4 %
Eosinophils Absolute: 694 cells/uL — ABNORMAL HIGH (ref 15–500)
Eosinophils Relative: 6.8 %
HCT: 43.3 % (ref 35.0–45.0)
Hemoglobin: 14.4 g/dL (ref 11.7–15.5)
Lymphs Abs: 3040 cells/uL (ref 850–3900)
MCH: 31.4 pg (ref 27.0–33.0)
MCHC: 33.3 g/dL (ref 32.0–36.0)
MCV: 94.5 fL (ref 80.0–100.0)
MPV: 9.8 fL (ref 7.5–12.5)
Monocytes Relative: 5.8 %
Neutro Abs: 5834 cells/uL (ref 1500–7800)
Neutrophils Relative %: 57.2 %
Platelets: 317 10*3/uL (ref 140–400)
RBC: 4.58 10*6/uL (ref 3.80–5.10)
RDW: 13.6 % (ref 11.0–15.0)
Total Lymphocyte: 29.8 %
WBC: 10.2 10*3/uL (ref 3.8–10.8)

## 2021-03-26 LAB — COMPREHENSIVE METABOLIC PANEL
AG Ratio: 1.9 (calc) (ref 1.0–2.5)
ALT: 17 U/L (ref 6–29)
AST: 17 U/L (ref 10–35)
Albumin: 4.2 g/dL (ref 3.6–5.1)
Alkaline phosphatase (APISO): 69 U/L (ref 37–153)
BUN: 9 mg/dL (ref 7–25)
CO2: 32 mmol/L (ref 20–32)
Calcium: 9.3 mg/dL (ref 8.6–10.4)
Chloride: 101 mmol/L (ref 98–110)
Creat: 0.73 mg/dL (ref 0.50–1.03)
Globulin: 2.2 g/dL (calc) (ref 1.9–3.7)
Glucose, Bld: 81 mg/dL (ref 65–139)
Potassium: 4 mmol/L (ref 3.5–5.3)
Sodium: 140 mmol/L (ref 135–146)
Total Bilirubin: 0.5 mg/dL (ref 0.2–1.2)
Total Protein: 6.4 g/dL (ref 6.1–8.1)

## 2021-03-26 LAB — ALPHA-GAL PANEL
Beef IgE: 0.14 kU/L (ref <0.35)
Class: 0
Class: 0
Galactose-alpha-1,3-galactose IgE: 0.44 kU/L — ABNORMAL HIGH (ref <0.10)
LAMB/MUTTON IGE: <0.1 kU/L (ref <0.35)
Pork IgE: <0.1 kU/L (ref <0.35)

## 2021-03-26 LAB — TISSUE TRANSGLUTAMINASE, IGA: (tTG) Ab, IgA: <1 U/mL

## 2021-03-26 LAB — IGA: Immunoglobulin A: 250 mg/dL (ref 47–310)

## 2021-03-26 NOTE — Telephone Encounter (Signed)
Appears phone note also sent in. Patient aware Magda Paganini is out and can address on Wednesday.

## 2021-03-26 NOTE — Telephone Encounter (Signed)
Noted see result note.

## 2021-03-26 NOTE — Telephone Encounter (Signed)
Pt calling to see if her lab results are back. She said it was in  [facility] and now they're not. She also has questions about her procedure. Please call [phone]

## 2021-03-26 NOTE — Telephone Encounter (Signed)
Thank you for your prior authorization submitted under Case ID# [id number] for Cheyenne Hamilton. This email is to confirm that your prior authorization request has been processed and status has been updated as Not Required. Comments/Confirmation No: Prior authorization is not required, and Predetermination is not recommended

## 2021-03-26 NOTE — Telephone Encounter (Signed)
Pt called stating that she was able to see her lab results in her mychart on Friday but now they are no longer visible. Pt states that she did send Magda Paganini a message through [facility] regarding one of them. I informed pt that leslie would return on Wed and that I would call her with the results once Magda Paganini has had a chance to review them. Pt verbalized understanding.

## 2021-03-27 ENCOUNTER — Other Ambulatory Visit: Payer: Self-pay | Admitting: *Deleted

## 2021-03-27 DIAGNOSIS — Z91018 Allergy to other foods: Secondary | ICD-10-CM

## 2021-03-27 NOTE — Telephone Encounter (Signed)
Pt was made aware and verbalized understanding. Clinical pool is arranging referral to allergist.

## 2021-04-16 NOTE — Patient Instructions (Signed)
Cheyenne Hamilton  04/16/2021     @PREFPERIOPPHARMACY @   Your procedure is scheduled on  04/19/2021.   [facility]   Call this number if you have problems the morning of surgery:  [phone]   Remember:  Follow the diet and prep instructions given to you by the office.    Take these medicines the morning of surgery with A SIP OF WATER                               lexapro, omeprazole.     Do not wear jewelry, make-up or nail polish.  Do not wear lotions, powders, or perfumes, or deodorant.  Do not shave 48 hours prior to surgery.  Men may shave face and neck.  Do not bring valuables to the hospital.  [facility] is not responsible for any belongings or valuables.  Contacts, dentures or bridgework may not be worn into surgery.  Leave your suitcase in the car.  After surgery it may be brought to your room.  For patients admitted to the hospital, discharge time will be determined by your treatment team.  Patients discharged the day of surgery will not be allowed to drive home and must  have someone with them for 24 hours.    Special instructions:   DO NOT smoke tobacco or vape for 24 hours before your procedure.  Please read over the following fact sheets that you were given. Anesthesia Post-op Instructions and Care and Recovery After Surgery      Upper Endoscopy, Adult, Care After This sheet gives you information about how to care for yourself after your procedure. Your health care provider may also give you more specific instructions. If you have problems or questions, contact your health care provider. What can I expect after the procedure? After the procedure, it is common to have: A sore throat. Mild stomach pain or discomfort. Bloating. Nausea. Follow these instructions at home:  Follow instructions from your health care provider about what to eat or drink after your procedure. Return to your normal activities as told by  your health care provider. Ask your health care provider what activities are safe for you. Take over-the-counter and prescription medicines only as told by your health care provider. If you were given a sedative during the procedure, it can affect you for several hours. Do not drive or operate machinery until your health care provider says that it is safe. Keep all follow-up visits as told by your health care provider. This is important. Contact a health care provider if you have: A sore throat that lasts longer than one day. Trouble swallowing. Get help right away if: You vomit blood or your vomit looks like coffee grounds. You have: A fever. Bloody, black, or tarry stools. A severe sore throat or you cannot swallow. Difficulty breathing. Severe pain in your chest or abdomen. Summary After the procedure, it is common to have a sore throat, mild stomach discomfort, bloating, and nausea. If you were given a sedative during the procedure, it can affect you for several hours. Do not drive or operate machinery until your health care provider says that it is safe. Follow instructions from your health care provider about what to eat or drink after your procedure. Return to your normal activities as told by your health care provider. This information is not intended  to replace advice given to you by your health care provider. Make sure you discuss any questions you have with your health care provider. Document Revised: 04/02/2019 Document Reviewed: 10/27/2017 Elsevier Patient Education  2022 [place]. Colonoscopy, Adult, Care After This sheet gives you information about how to care for yourself after your procedure. Your health care provider may also give you more specific instructions. If you have problems or questions, contact your health care provider. What can I expect after the procedure? After the procedure, it is common to have: A small amount of blood in your stool for 24 hours after  the procedure. Some gas. Mild cramping or bloating of your abdomen. Follow these instructions at home: Eating and drinking  Drink enough fluid to keep your urine pale yellow. Follow instructions from your health care provider about eating or drinking restrictions. Resume your normal diet as instructed by your health care provider. Avoid heavy or fried foods that are hard to digest. Activity Rest as told by your health care provider. Avoid sitting for a long time without moving. Get up to take short walks every 1-2 hours. This is important to improve blood flow and breathing. Ask for help if you feel weak or unsteady. Return to your normal activities as told by your health care provider. Ask your health care provider what activities are safe for you. Managing cramping and bloating  Try walking around when you have cramps or feel bloated. Apply heat to your abdomen as told by your health care provider. Use the heat source that your health care provider recommends, such as a moist heat pack or a heating pad. Place a towel between your skin and the heat source. Leave the heat on for 20-30 minutes. Remove the heat if your skin turns bright red. This is especially important if you are unable to feel pain, heat, or cold. You may have a greater risk of getting burned. General instructions If you were given a sedative during the procedure, it can affect you for several hours. Do not drive or operate machinery until your health care provider says that it is safe. For the first 24 hours after the procedure: Do not sign important documents. Do not drink alcohol. Do your regular daily activities at a slower pace than normal. Eat soft foods that are easy to digest. Take over-the-counter and prescription medicines only as told by your health care provider. Keep all follow-up visits as told by your health care provider. This is important. Contact a health care provider if: You have blood in your stool  2-3 days after the procedure. Get help right away if you have: More than a small spotting of blood in your stool. Large blood clots in your stool. Swelling of your abdomen. Nausea or vomiting. A fever. Increasing pain in your abdomen that is not relieved with medicine. Summary After the procedure, it is common to have a small amount of blood in your stool. You may also have mild cramping and bloating of your abdomen. If you were given a sedative during the procedure, it can affect you for several hours. Do not drive or operate machinery until your health care provider says that it is safe. Get help right away if you have a lot of blood in your stool, nausea or vomiting, a fever, or increased pain in your abdomen. This information is not intended to replace advice given to you by your health care provider. Make sure you discuss any questions you have with your health  care provider. Document Revised: 04/02/2019 Document Reviewed: 12/21/2018 Elsevier Patient Education  [place] After This sheet gives you information about how to care for yourself after your procedure. Your health care provider may also give you more specific instructions. If you have problems or questions, contact your health care provider. What can I expect after the procedure? After the procedure, it is common to have: Tiredness. Forgetfulness about what happened after the procedure. Impaired judgment for important decisions. Nausea or vomiting. Some difficulty with balance. Follow these instructions at home: For the time period you were told by your health care provider:   Rest as needed. Do not participate in activities where you could fall or become injured. Do not drive or use machinery. Do not drink alcohol. Do not take sleeping pills or medicines that cause drowsiness. Do not make important decisions or sign legal documents. Do not take care of children on your  own. Eating and drinking Follow the diet that is recommended by your health care provider. Drink enough fluid to keep your urine pale yellow. If you vomit: Drink water, juice, or soup when you can drink without vomiting. Make sure you have little or no nausea before eating solid foods. General instructions Have a responsible adult stay with you for the time you are told. It is important to have someone help care for you until you are awake and alert. Take over-the-counter and prescription medicines only as told by your health care provider. If you have sleep apnea, surgery and certain medicines can increase your risk for breathing problems. Follow instructions from your health care provider about wearing your sleep device: Anytime you are sleeping, including during daytime naps. While taking prescription pain medicines, sleeping medicines, or medicines that make you drowsy. Avoid smoking. Keep all follow-up visits as told by your health care provider. This is important. Contact a health care provider if: You keep feeling nauseous or you keep vomiting. You feel light-headed. You are still sleepy or having trouble with balance after 24 hours. You develop a rash. You have a fever. You have redness or swelling around the IV site. Get help right away if: You have trouble breathing. You have new-onset confusion at home. Summary For several hours after your procedure, you may feel tired. You may also be forgetful and have poor judgment. Have a responsible adult stay with you for the time you are told. It is important to have someone help care for you until you are awake and alert. Rest as told. Do not drive or operate machinery. Do not drink alcohol or take sleeping pills. Get help right away if you have trouble breathing, or if you suddenly become confused. This information is not intended to replace advice given to you by your health care provider. Make sure you discuss any questions you  have with your health care provider. Document Revised: 02/10/2020 Document Reviewed: 04/29/2019 Elsevier Patient Education  2022 [employer].

## 2021-04-17 ENCOUNTER — Other Ambulatory Visit (HOSPITAL_COMMUNITY): Payer: Commercial Managed Care - PPO

## 2021-04-18 ENCOUNTER — Other Ambulatory Visit (HOSPITAL_COMMUNITY): Payer: Commercial Managed Care - PPO

## 2021-04-18 ENCOUNTER — Encounter (HOSPITAL_COMMUNITY)
Admission: RE | Admit: 2021-04-18 | Discharge: 2021-04-18 | Disposition: A | Discharge status: Home / Self Care | Payer: Commercial Managed Care - PPO | Source: Ambulatory Visit | Attending: Internal Medicine | Admitting: Internal Medicine

## 2021-04-18 ENCOUNTER — Other Ambulatory Visit: Payer: Self-pay

## 2021-04-18 ENCOUNTER — Encounter (HOSPITAL_COMMUNITY): Payer: Self-pay

## 2021-04-18 HISTORY — DX: Prediabetes: R73.03

## 2021-04-18 HISTORY — DX: Unspecified glaucoma: H40.9

## 2021-04-19 ENCOUNTER — Other Ambulatory Visit: Payer: Self-pay

## 2021-04-19 ENCOUNTER — Encounter (HOSPITAL_COMMUNITY): Payer: Self-pay | Admitting: Internal Medicine

## 2021-04-19 ENCOUNTER — Ambulatory Visit (HOSPITAL_COMMUNITY): Payer: Commercial Managed Care - PPO | Admitting: Anesthesiology

## 2021-04-19 ENCOUNTER — Ambulatory Visit (HOSPITAL_COMMUNITY)
Admission: RE | Admit: 2021-04-19 | Discharge: 2021-04-19 | Disposition: A | Discharge status: Home / Self Care | Payer: Commercial Managed Care - PPO | Attending: Internal Medicine | Admitting: Internal Medicine

## 2021-04-19 ENCOUNTER — Encounter (HOSPITAL_COMMUNITY)
Admission: RE | Disposition: A | Discharge status: Home / Self Care | Payer: Self-pay | Source: Home / Self Care | Attending: Internal Medicine

## 2021-04-19 DIAGNOSIS — K5730 Diverticulosis of large intestine without perforation or abscess without bleeding: Secondary | ICD-10-CM | POA: Insufficient documentation

## 2021-04-19 DIAGNOSIS — K640 First degree hemorrhoids: Secondary | ICD-10-CM | POA: Diagnosis not present

## 2021-04-19 DIAGNOSIS — K3189 Other diseases of stomach and duodenum: Secondary | ICD-10-CM | POA: Insufficient documentation

## 2021-04-19 DIAGNOSIS — Z1212 Encounter for screening for malignant neoplasm of rectum: Secondary | ICD-10-CM

## 2021-04-19 DIAGNOSIS — K921 Melena: Secondary | ICD-10-CM | POA: Insufficient documentation

## 2021-04-19 DIAGNOSIS — R195 Other fecal abnormalities: Secondary | ICD-10-CM | POA: Diagnosis not present

## 2021-04-19 DIAGNOSIS — Z86010 Personal history of colonic polyps: Secondary | ICD-10-CM | POA: Diagnosis not present

## 2021-04-19 DIAGNOSIS — K219 Gastro-esophageal reflux disease without esophagitis: Secondary | ICD-10-CM | POA: Diagnosis not present

## 2021-04-19 DIAGNOSIS — F17200 Nicotine dependence, unspecified, uncomplicated: Secondary | ICD-10-CM | POA: Insufficient documentation

## 2021-04-19 DIAGNOSIS — Z6841 Body Mass Index (BMI) 40.0 and over, adult: Secondary | ICD-10-CM | POA: Diagnosis not present

## 2021-04-19 DIAGNOSIS — Z1211 Encounter for screening for malignant neoplasm of colon: Secondary | ICD-10-CM

## 2021-04-19 HISTORY — PX: BIOPSY: SHX5522

## 2021-04-19 HISTORY — PX: ESOPHAGOGASTRODUODENOSCOPY (EGD) WITH PROPOFOL: SHX5813

## 2021-04-19 HISTORY — PX: COLONOSCOPY WITH PROPOFOL: SHX5780

## 2021-04-19 LAB — GLUCOSE, CAPILLARY: Glucose-Capillary: 106 mg/dL — ABNORMAL HIGH (ref 70–99)

## 2021-04-19 SURGERY — COLONOSCOPY WITH PROPOFOL
Anesthesia: General

## 2021-04-19 MED ORDER — STERILE WATER FOR IRRIGATION IR SOLN
Status: DC | PRN
  Administered 2021-04-19: 120 mL

## 2021-04-19 MED ORDER — PROPOFOL 10 MG/ML IV BOLUS
INTRAVENOUS | Status: DC | PRN
  Administered 2021-04-19: 50 mg via INTRAVENOUS
  Administered 2021-04-19: 30 mg via INTRAVENOUS
  Administered 2021-04-19: 100 mg via INTRAVENOUS

## 2021-04-19 MED ORDER — LIDOCAINE HCL (CARDIAC) PF 100 MG/5ML IV SOSY
PREFILLED_SYRINGE | INTRAVENOUS | Status: DC | PRN
  Administered 2021-04-19: 100 mg via INTRAVENOUS

## 2021-04-19 MED ORDER — LACTATED RINGERS IV SOLN: INTRAVENOUS | Status: DC

## 2021-04-19 NOTE — Interval H&P Note (Signed)
History and Physical Interval Note:  04/19/2021 9:42 AM  Cheyenne Hamilton  has presented today for surgery, with the diagnosis of heme positive stool, chronic diarrhea, gerd, h/o colon polyps, upepr abd pain.  The various methods of treatment have been discussed with the patient and family. After consideration of risks, benefits and other options for treatment, the patient has consented to  Procedure(s) with comments: COLONOSCOPY WITH PROPOFOL (N/A) - 9:15am (pt requests pre-op on 11/9 after 10:15am if possible) ESOPHAGOGASTRODUODENOSCOPY (EGD) WITH PROPOFOL (N/A) as a surgical intervention.  The patient's history has been reviewed, patient examined, no change in status, stable for surgery.  I have reviewed the patient's chart and labs.  Questions were answered to the patient's satisfaction.     [name]  No change.  Patient denies dysphagia.  Diagnostic EGD and colonoscopy with appropriate biopsies per plan.  The risks, benefits, limitations, imponderables and alternatives regarding both EGD and colonoscopy have been reviewed with the patient. Questions have been answered. All parties agreeable.

## 2021-04-19 NOTE — Op Note (Signed)
[facility] Patient Name: Cheyenne Hamilton Procedure Date: 04/19/2021 9:15 AM MRN: [id number] Date of Birth: Jun 14, 1965 Attending MD: Norvel Richards , MD CSN: [id number] Age: 55 Admit Type: Outpatient Procedure:                Upper GI endoscopy Indications:              Epigastric abdominal pain Providers:                Norvel Richards, MD, Caprice Kluver, Randa Spike, Technician Referring MD:              Medicines:                Propofol per Anesthesia Complications:            No immediate complications. Estimated Blood Loss:     Estimated blood loss was minimal. Procedure:                Pre-Anesthesia Assessment:                           - Prior to the procedure, a History and Physical                            was performed, and patient medications and                            allergies were reviewed. The patient's tolerance of                            previous anesthesia was also reviewed. The risks                            and benefits of the procedure and the sedation                            options and risks were discussed with the patient.                            All questions were answered, and informed consent                            was obtained. Prior Anticoagulants: The patient has                            taken no previous anticoagulant or antiplatelet                            agents. ASA Grade Assessment: II - A patient with                            mild systemic disease. After reviewing the risks  and benefits, the patient was deemed in                            satisfactory condition to undergo the procedure.                           After obtaining informed consent, the endoscope was                            passed under direct vision. Throughout the                            procedure, the patient's blood pressure, pulse, and                            oxygen  saturations were monitored continuously. The                            [id number]-H190 ([id number] scope was introduced through the                            mouth, and advanced to the third part of duodenum.                            The upper GI endoscopy was accomplished without                            difficulty. The patient tolerated the procedure                            well. Scope In: 9:54:38 AM Scope Out: 10:01:18 AM Total Procedure Duration: 0 hours 6 minutes 40 seconds  Findings:      The examined esophagus was normal.      Diffuse mottling of the gastric most mucosa. No ulcer or infiltrating       process observed. Focal erosions in the antrum. Pylorus patent easily       traversed.      The duodenal bulb, second portion of the duodenum and third portion of       the duodenum were normal. Biopsies of the second third portion of the       duodenum taken for histologic study. Finally, biopsies of the antrum       body and cardia taken for histologic study. Impression:               - Normal esophagus.                           - Mucosal changes in the stomach as                            described?"status post biopsy                           - Normal duodenal bulb, second portion of the  duodenum and third portion of the duodenum. Status                            post duodenal biopsy Moderate Sedation:      Moderate (conscious) sedation was personally administered by an       anesthesia professional. The following parameters were monitored: oxygen       saturation, heart rate, blood pressure, respiratory rate, EKG, adequacy       of pulmonary ventilation, and response to care. Recommendation:           - Patient has a contact number available for                            emergencies. The signs and symptoms of potential                            delayed complications were discussed with the                            patient. Return to normal  activities tomorrow.                            Written discharge instructions were provided to the                            patient.                           - Advance diet as tolerated. Follow-up on                            pathology. See colonoscopy report. Procedure Code(s):        --- Professional ---                           [phone], Esophagogastroduodenoscopy, flexible,                            transoral; diagnostic, including collection of                            specimen(s) by brushing or washing, when performed                            (separate procedure) Diagnosis Code(s):        --- Professional ---                           K31.89, Other diseases of stomach and duodenum                           R10.13, Epigastric pain CPT copyright 2019 American Medical Association. All rights reserved. The codes documented in this report are preliminary and upon coder review may  be revised to meet current compliance requirements. Cristopher Estimable. Briley Sulton, MD Norvel Richards, MD 04/19/2021 10:36:41 AM This report has been signed electronically. Number of  Addenda: 0

## 2021-04-19 NOTE — Transfer of Care (Signed)
Immediate Anesthesia Transfer of Care Note  Patient: Cheyenne Hamilton  Procedure(s) Performed: COLONOSCOPY WITH PROPOFOL ESOPHAGOGASTRODUODENOSCOPY (EGD) WITH PROPOFOL BIOPSY  Patient Location: PACU and Short Stay  Anesthesia Type:General  Level of Consciousness: awake, alert  and oriented  Airway & Oxygen Therapy: Patient Spontanous Breathing  Post-op Assessment: Report given to RN and Post -op Vital signs reviewed and stable  Post vital signs: Reviewed and stable  Last Vitals:  Vitals Value Taken Time  BP 109/57 04/19/21 1033  Temp 37 C 04/19/21 1033  Pulse 78 04/19/21 1030  Resp 16 04/19/21 1033  SpO2 95 % 04/19/21 1033    Last Pain:  Vitals:   04/19/21 1033  TempSrc: Tympanic  PainSc: 0-No pain         Complications: No notable events documented.

## 2021-04-19 NOTE — Op Note (Signed)
[facility] Patient Name: Cheyenne Hamilton Procedure Date: 04/19/2021 10:03 AM MRN: [id number] Date of Birth: 10/08/1965 Attending MD: Cheyenne Hamilton , MD CSN: [id number] Age: 55 Admit Type: Outpatient Procedure:                Colonoscopy Indications:              Heme positive stool Providers:                Cheyenne Richards, MD, Caprice Kluver, Randa Spike, Technician Referring MD:              Medicines:                Propofol per Anesthesia Complications:            No immediate complications. Estimated Blood Loss:     Estimated blood loss was minimal. Procedure:                Pre-Anesthesia Assessment:                           - Prior to the procedure, a History and Physical                            was performed, and patient medications and                            allergies were reviewed. The patient's tolerance of                            previous anesthesia was also reviewed. The risks                            and benefits of the procedure and the sedation                            options and risks were discussed with the patient.                            All questions were answered, and informed consent                            was obtained. Prior Anticoagulants: The patient has                            taken no previous anticoagulant or antiplatelet                            agents. ASA Grade Assessment: II - A patient with                            mild systemic disease. After reviewing the risks  and benefits, the patient was deemed in                            satisfactory condition to undergo the procedure.                           After obtaining informed consent, the colonoscope                            was passed under direct vision. Throughout the                            procedure, the patient's blood pressure, pulse, and                            oxygen saturations were  monitored continuously. The                            [id number]) scope was introduced through the                            anus and advanced to the the cecum, identified by                            appendiceal orifice and ileocecal valve. The                            colonoscopy was performed without difficulty. The                            patient tolerated the procedure well. The quality                            of the bowel preparation was adequate. The                            ileocecal valve, appendiceal orifice, and rectum                            were photographed. The entire colon was well                            visualized. Scope In: 10:07:01 AM Scope Out: 10:23:33 AM Scope Withdrawal Time: 0 hours 9 minutes 39 seconds  Total Procedure Duration: 0 hours 16 minutes 32 seconds  Findings:      The perianal and digital rectal examinations were normal.      Scattered medium-mouthed diverticula were found in the entire colon.      Non-bleeding internal hemorrhoids were found during retroflexion. The       hemorrhoids were moderate, medium-sized and Grade I (internal       hemorrhoids that do not prolapse).      The exam was otherwise without abnormality on direct and retroflexion       views. Segmental biopsies of the right left colon taken for histologic  study. Due to angulation, I was unable to intubate the terminal ileum. Impression:               - Diverticulosis in the entire examined colon.                           - Non-bleeding internal hemorrhoids.                           - The examination was otherwise normal on direct                            and retroflexion views.                           Status post segmental biopsy. Moderate Sedation:      Moderate (conscious) sedation was personally administered by an       anesthesia professional. The following parameters were monitored: oxygen       saturation, heart rate, blood pressure,  respiratory rate, EKG, adequacy       of pulmonary ventilation, and response to care. Recommendation:           - Patient has a contact number available for                            emergencies. The signs and symptoms of potential                            delayed complications were discussed with the                            patient. Return to normal activities tomorrow.                            Written discharge instructions were provided to the                            patient.                           - Advance diet as tolerated.                           - Repeat colonoscopy in 5 years for surveillance.                           - Return to GI office in 6 weeks. Follow-up on                            pathology. See EGD report. Procedure Code(s):        --- Professional ---                           [phone], Colonoscopy, flexible; diagnostic, including                            collection  of specimen(s) by brushing or washing,                            when performed (separate procedure) Diagnosis Code(s):        --- Professional ---                           K64.0, First degree hemorrhoids                           R19.5, Other fecal abnormalities                           K57.30, Diverticulosis of large intestine without                            perforation or abscess without bleeding CPT copyright 2019 American Medical Association. All rights reserved. The codes documented in this report are preliminary and upon coder review may  be revised to meet current compliance requirements. Cheyenne Hamilton. Cheyenne Beauchaine, MD Cheyenne Richards, MD 04/19/2021 10:40:42 AM This report has been signed electronically. Number of Addenda: 0

## 2021-04-19 NOTE — Anesthesia Postprocedure Evaluation (Signed)
Anesthesia Post Note  Patient: Cheyenne Hamilton  Procedure(s) Performed: COLONOSCOPY WITH PROPOFOL ESOPHAGOGASTRODUODENOSCOPY (EGD) WITH PROPOFOL BIOPSY  Patient location during evaluation: Phase II Anesthesia Type: General Level of consciousness: awake and alert and oriented Pain management: pain level controlled Vital Signs Assessment: post-procedure vital signs reviewed and stable Respiratory status: spontaneous breathing and respiratory function stable Cardiovascular status: blood pressure returned to baseline and stable Postop Assessment: no apparent nausea or vomiting Anesthetic complications: no   No notable events documented.   Last Vitals:  Vitals:   04/19/21 1030 04/19/21 1033  BP: (!) 109/57 (!) 109/57  Pulse: 78   Resp: 18 16  Temp: 36.6 C 37 C  SpO2: 95% 95%    Last Pain:  Vitals:   04/19/21 1033  TempSrc: Tympanic  PainSc: 0-No pain                 Cheyenne Hamilton

## 2021-04-19 NOTE — Anesthesia Preprocedure Evaluation (Addendum)
Anesthesia Evaluation  Patient identified by MRN, date of birth, ID band Patient awake    Reviewed: Allergy & Precautions, NPO status , Patient's Chart, lab work & pertinent test results  History of Anesthesia Complications (+) PROLONGED EMERGENCE and history of anesthetic complications  Airway Mallampati: III  TM Distance: >3 FB Neck ROM: Full    Dental  (+) Dental Advisory Given   Pulmonary sleep apnea and Continuous Positive Airway Pressure Ventilation , Current Smoker and Patient abstained from smoking.,    Pulmonary exam normal breath sounds clear to auscultation       Cardiovascular Normal cardiovascular exam Rhythm:Regular Rate:Normal     Neuro/Psych  Headaches, PSYCHIATRIC DISORDERS Anxiety Depression    GI/Hepatic Neg liver ROS, GERD  Medicated and Controlled,  Endo/Other  Morbid obesity  Renal/GU negative Renal ROS     Musculoskeletal negative musculoskeletal ROS (+)   Abdominal   Peds  Hematology negative hematology ROS (+)   Anesthesia Other Findings   Reproductive/Obstetrics negative OB ROS                            Anesthesia Physical Anesthesia Plan  ASA: 3  Anesthesia Plan: General   Post-op Pain Management:    Induction: Intravenous  PONV Risk Score and Plan: TIVA  Airway Management Planned: Nasal Cannula, Natural Airway and Simple Face Mask  Additional Equipment:   Intra-op Plan:   Post-operative Plan:   Informed Consent: I have reviewed the patients History and Physical, chart, labs and discussed the procedure including the risks, benefits and alternatives for the proposed anesthesia with the patient or authorized representative who has indicated his/her understanding and acceptance.     Dental advisory given  Plan Discussed with: CRNA and Surgeon  Anesthesia Plan Comments:        Anesthesia Quick Evaluation

## 2021-04-19 NOTE — Discharge Instructions (Signed)
Colonoscopy Discharge Instructions  Read the instructions outlined below and refer to this sheet in the next few weeks. These discharge instructions provide you with general information on caring for yourself after you leave the hospital. Your doctor may also give you specific instructions. While your treatment has been planned according to the most current medical practices available, unavoidable complications occasionally occur. If you have any problems or questions after discharge, call Dr. Gala Romney at [phone]. ACTIVITY You may resume your regular activity, but move at a slower pace for the next 24 hours.  Take frequent rest periods for the next 24 hours.  Walking will help get rid of the air and reduce the bloated feeling in your belly (abdomen).  No driving for 24 hours (because of the medicine (anesthesia) used during the test).   Do not sign any important legal documents or operate any machinery for 24 hours (because of the anesthesia used during the test).  NUTRITION Drink plenty of fluids.  You may resume your normal diet as instructed by your doctor.  Begin with a light meal and progress to your normal diet. Heavy or fried foods are harder to digest and may make you feel sick to your stomach (nauseated).  Avoid alcoholic beverages for 24 hours or as instructed.  MEDICATIONS You may resume your normal medications unless your doctor tells you otherwise.  WHAT YOU CAN EXPECT TODAY Some feelings of bloating in the abdomen.  Passage of more gas than usual.  Spotting of blood in your stool or on the toilet paper.  IF YOU HAD POLYPS REMOVED DURING THE COLONOSCOPY: No aspirin products for 7 days or as instructed.  No alcohol for 7 days or as instructed.  Eat a soft diet for the next 24 hours.  FINDING OUT THE RESULTS OF YOUR TEST Not all test results are available during your visit. If your test results are not back during the visit, make an appointment with your caregiver to find out the  results. Do not assume everything is normal if you have not heard from your caregiver or the medical facility. It is important for you to follow up on all of your test results.  SEEK IMMEDIATE MEDICAL ATTENTION IF: You have more than a spotting of blood in your stool.  Your belly is swollen (abdominal distention).  You are nauseated or vomiting.  You have a temperature over 101.  You have abdominal pain or discomfort that is severe or gets worse throughout the day.    EGD Discharge instructions Please read the instructions outlined below and refer to this sheet in the next few weeks. These discharge instructions provide you with general information on caring for yourself after you leave the hospital. Your doctor may also give you specific instructions. While your treatment has been planned according to the most current medical practices available, unavoidable complications occasionally occur. If you have any problems or questions after discharge, please call your doctor. ACTIVITY You may resume your regular activity but move at a slower pace for the next 24 hours.  Take frequent rest periods for the next 24 hours.  Walking will help expel (get rid of) the air and reduce the bloated feeling in your abdomen.  No driving for 24 hours (because of the anesthesia (medicine) used during the test).  You may shower.  Do not sign any important legal documents or operate any machinery for 24 hours (because of the anesthesia used during the test).  NUTRITION Drink plenty of fluids.  You  may resume your normal diet.  Begin with a light meal and progress to your normal diet.  Avoid alcoholic beverages for 24 hours or as instructed by your caregiver.  MEDICATIONS You may resume your normal medications unless your caregiver tells you otherwise.  WHAT YOU CAN EXPECT TODAY You may experience abdominal discomfort such as a feeling of fullness or "gas" pains.  FOLLOW-UP Your doctor will discuss the results  of your test with you.  SEEK IMMEDIATE MEDICAL ATTENTION IF ANY OF THE FOLLOWING OCCUR: Excessive nausea (feeling sick to your stomach) and/or vomiting.  Severe abdominal pain and distention (swelling).  Trouble swallowing.  Temperature over 101 F (37.8 C).  Rectal bleeding or vomiting of blood.    Your stomach appeared inflamed today.  Biopsies were taken.  Diverticulosis found in your colon.  No polyp or tumor.  You do have internal hemorrhoids biopsies were taken.  Information on diverticulosis and hemorrhoids provided  Further recommendations to follow once biopsy report returns for review  Repeat colonoscopy in 5 years.  Office visit with Neil Crouch in 6 weeks  At patient request, I called Marcello Moores at [phone] -reviewed findings and recommendations

## 2021-04-20 LAB — SURGICAL PATHOLOGY

## 2021-04-22 ENCOUNTER — Encounter: Payer: Self-pay | Admitting: Internal Medicine

## 2021-04-25 ENCOUNTER — Encounter (HOSPITAL_COMMUNITY): Payer: Self-pay | Admitting: Internal Medicine

## 2021-05-02 ENCOUNTER — Telehealth: Payer: Self-pay | Admitting: *Deleted

## 2021-05-02 NOTE — Telephone Encounter (Signed)
Pt is needing to be put on the cancellation list for Cheyenne Hamilton after Jan 06 if one comes open on a wed or Thursday.

## 2021-05-02 NOTE — Telephone Encounter (Signed)
Patient called in. She states at her discharge day of procedure she received a handout for diverticulosis. She wants more info on this and also wants to know types of food she needs to stay away from. She is scheduled for OV in March for follow.

## 2021-06-15 ENCOUNTER — Ambulatory Visit: Payer: Commercial Managed Care - PPO | Admitting: Allergy & Immunology

## 2021-07-20 ENCOUNTER — Ambulatory Visit: Payer: Commercial Managed Care - PPO | Admitting: Gastroenterology

## 2021-07-25 ENCOUNTER — Encounter: Payer: Self-pay | Admitting: Allergy & Immunology

## 2021-07-25 ENCOUNTER — Ambulatory Visit: Payer: Commercial Managed Care - PPO | Admitting: Allergy & Immunology

## 2021-07-25 ENCOUNTER — Other Ambulatory Visit: Payer: Self-pay

## 2021-07-25 VITALS — BP 128/82 | HR 96 | Temp 97.6°F | Resp 18 | Ht 64.0 in | Wt 288.4 lb

## 2021-07-25 DIAGNOSIS — K9049 Malabsorption due to intolerance, not elsewhere classified: Secondary | ICD-10-CM

## 2021-07-25 DIAGNOSIS — J310 Chronic rhinitis: Secondary | ICD-10-CM | POA: Diagnosis not present

## 2021-07-25 DIAGNOSIS — J4520 Mild intermittent asthma, uncomplicated: Secondary | ICD-10-CM

## 2021-07-25 NOTE — Progress Notes (Signed)
NEW PATIENT  Date of Service/Encounter:  07/25/21  Consult requested by: Marquez, [place], [place]   Assessment:   Chronic rhinitis  Food intolerance  Mild intermittent allergic asthma, uncomplicated  Plan/Recommendations:   1. Chronic rhinitis - did not do intradermal testing - Testing was negative to the entire panel. - Copy of testing results provided today. - I would try using Astelin two sprays per nostril twice daily during the summer months when you are outdoors and exposed to grass to see if this can help (this is a nasal antihistamine and although it tastes terrible, it works well).  2. Food intolerance - Testing was negative to the entire panel. - Copy of testing results provided. - There is a the low positive predictive value of food allergy testing and hence the high possibility of false positives. - In contrast, food allergy testing has a high negative predictive value, therefore if testing is negative we can be relatively assured that they are indeed negative.  - I do not think that you to have an EpiPen. - This still could be an intolerance, however.  - We are going to get an alpha gal panel to see where this is trending. - We are also going to get lab work to look for a mast cell disease (I think this is unlikely).  3. Likely allergic asthma - We are going to give you an albuterol inhaler to use as needed.  - You can even take two puffs before going out to [place].   4. Return in about 3 months (around 10/22/2021).    This note in its entirety was forwarded to the Provider who requested this consultation.  Subjective:   Cheyenne Hamilton is a 56 y.o. female presenting today for evaluation of  Chief Complaint  Patient presents with   Allergy Testing    Lab/skin test regarding Alpha Gal Patient states she has a Hx IBS - GI did lab work( in chart)    Cheyenne Hamilton has a history of the following: Patient Active Problem List   Diagnosis Date Noted   Heme +  stool 03/21/2021   Abdominal pain 03/21/2021   H/O adenomatous polyp of colon 03/21/2021   GERD (gastroesophageal reflux disease) 03/21/2021   Other hyperlipidemia 04/20/2020   Vitamin D deficiency 03/06/2020   Pre-diabetes 02/22/2020   Class 3 severe obesity with serious comorbidity and body mass index (BMI) of 50.0 to 59.9 in adult [facility]) 02/22/2020   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  Cheyenne Hamilton was referred by Denny Levy, [place].     Cheyenne Hamilton is a 56 y.o. female presenting for an evaluation of asthma and allergies .  She has had a history of IBS since 1987. She had a gallbladder removed at that time because she woke up with intense pain and it was gallstones. She is having diarrhea upwards of 10 times per day. Symptoms have worsened in the past 4 years or so. She had a colonoscopy a year early. She was also placed on Lomotil which has helped tremendously, now down to 3 times per day. She had a colonoscopy in November and this workup was completely negative.   She went to see Neil Crouch at Dr. Guerry Minors office. She did an alpha gal panel as part of her workup. She ended  up having an elevated IgE to 0.44. She got these results four months ago, but she was told that it was negative so she did not worry about avoiding beef. She estimates that she eats red meat around 4 times per week on average. They do eat a lot of chicken but also a lot of red meat in various forms and fashion.   She has tried cholestyramine which she did not tolerate. Symptoms have worsened in the last 4 years. She does not notice a pattern with a particular food. She can eat a piece of toast and then go to the bathroom immediately, but the next day there is no problem.   Asthma/Respiratory Symptom History:  She does use albuterol occasionally.  This only occurs when she is out mowing her yard.  She has alleviated her symptoms are prevented them by using a mask when she mows.  Allergic Rhinitis Symptom History: She has noticed that over the past 5-8 years when mowing season starts, she has issues with difficulty breathing. She wears a mask for the summer months to keep these symptoms at [place]. She does have an albuterol inhaler and she has not refilled it in years.   Skin Symptom History: She has a history of psoriasis. She is on MTX for this 6 pills every Monday. She is also on folic acid.   Otherwise, there is no history of other atopic diseases, including stinging insect allergies, eczema, urticaria, or contact dermatitis. There is no significant infectious history. Vaccinations are up to date.    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 If you have any questions, ask your nurse or doctor.          STOP taking these medications    ciprofloxacin 500 MG tablet Commonly known as: CIPRO Stopped by: Valentina Shaggy, MD       TAKE these medications    aspirin EC 81 MG  tablet Take 81 mg by mouth at bedtime. Swallow whole.   clobetasol cream 0.05 % Commonly known as: TEMOVATE Apply 1 application topically 2 (two) times daily as needed (irritation).   clonazePAM 0.5 MG tablet Commonly known as: KLONOPIN Take 1 mg by mouth at bedtime.   diclofenac Sodium 1 % Gel Commonly known as: VOLTAREN Apply 2 g topically 4 (four) times daily as needed (pain).   diphenoxylate-atropine 2.5-0.025 MG tablet Commonly known as: LOMOTIL Take 1 tablet by mouth daily.   doxycycline 100 MG capsule Commonly known as: VIBRAMYCIN Take 100 mg by mouth daily as needed (skin irritation).   escitalopram 10 MG tablet Commonly known as:  LEXAPRO Take 10 mg by mouth every morning.   folic acid 1 MG tablet Commonly known as: FOLVITE Take 1 mg by mouth every morning.   furosemide 20 MG tablet Commonly known as: LASIX Take 20 mg by mouth every morning.   methotrexate 2.5 MG tablet Commonly known as: RHEUMATREX Take 15 mg by mouth every Monday. 6 pills every monday   omeprazole 20 MG capsule Commonly known as: PRILOSEC Take 20 mg by mouth every morning.   Ozempic (0.25 or 0.5 MG/DOSE) 2 MG/1.5ML Sopn Generic drug: Semaglutide(0.25 or 0.5MG /DOS) Inject 0.5 mg into the skin every Sunday.   rosuvastatin 5 MG tablet Commonly known as: CRESTOR Take 5 mg by mouth every morning.        Birth History: non-contributory  Developmental History: non-contributory  Past Surgical History: Past Surgical History:  Procedure Laterality Date   BIOPSY  04/18/2017   Procedure: BIOPSY;  Surgeon: Daneil Dolin, MD;  [facility];  Service: Endoscopy;;  ascending/descending/sigmoid   BIOPSY  04/19/2021   Procedure: BIOPSY;  Surgeon: Daneil Dolin, MD;  [facility];  Service: Endoscopy;;   CERVICAL CERCLAGE     CHOLECYSTECTOMY     COLONOSCOPY WITH PROPOFOL N/A 04/18/2017   Procedure: COLONOSCOPY WITH PROPOFOL;  Surgeon: Daneil Dolin, MD;  [facility]  [facility];  Service: Endoscopy;  Laterality: N/A;  7:30am   COLONOSCOPY WITH PROPOFOL N/A 04/19/2021   Procedure: COLONOSCOPY WITH PROPOFOL;  Surgeon: Daneil Dolin, MD;  [facility];  Service: Endoscopy;  Laterality: N/A;  9:15am (pt requests pre-op on 11/9 after 10:15am if possible)   ESOPHAGOGASTRODUODENOSCOPY (EGD) WITH PROPOFOL N/A 04/19/2021   Procedure: ESOPHAGOGASTRODUODENOSCOPY (EGD) WITH PROPOFOL;  Surgeon: Daneil Dolin, MD;  [facility];  Service: Endoscopy;  Laterality: N/A;   POLYPECTOMY  04/18/2017   Procedure: POLYPECTOMY;  Surgeon: Daneil Dolin, MD;  [facility];  Service: Endoscopy;;  transverse colon     Family History: Family History  Problem Relation Age of Onset   Arthritis Other    Hypertension Mother    Thyroid disease Mother    Obesity Mother    Anxiety disorder Mother    Hypertension Father    Obesity Father      Social History: Cheyenne Hamilton lives at home with her family. She lives in a house that is 56 years old. There is hardwood and area rugs in the main parts of the house and carpeting in the bedroom. There are two dogs in the home. There are dust mite coverings on the bed, but not the pillows. 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    Review of Systems  Constitutional: Negative.  Negative for chills, fever, malaise/fatigue and weight loss.  HENT: Negative.  Negative for congestion, ear discharge and ear pain.   Eyes:  Negative for pain, discharge and redness.  Respiratory:  Negative for cough, sputum production, shortness of breath and wheezing.   Cardiovascular: Negative.  Negative for chest pain and palpitations.  Gastrointestinal:  Negative for abdominal pain, constipation, diarrhea, heartburn, nausea and vomiting.  Skin: Negative.  Negative for itching and rash.  Neurological:  Negative for dizziness and headaches.  Endo/Heme/Allergies:  Negative for environmental allergies. Does not  bruise/bleed easily.      Objective:   Blood pressure 128/82, pulse 96, temperature 97.6 F (36.4 C), resp. rate 18, height 5\' 4"  (1.626 m), weight 288 lb 6.4 oz (130.8 kg), SpO2  95 %. Body mass index is 49.5 kg/m.   Physical Exam Vitals reviewed.  Constitutional:      Appearance: Normal appearance. She is well-developed.     Comments: Interactive.  HENT:     Head: Normocephalic and atraumatic.     Right Ear: Tympanic membrane, ear canal and external ear normal. No drainage, swelling or tenderness. Tympanic membrane is not injected, scarred, erythematous, retracted or bulging.     Left Ear: Tympanic membrane, ear canal and external ear normal. No drainage, swelling or tenderness. Tympanic membrane is not injected, scarred, erythematous, retracted or bulging.     Nose: No nasal deformity, septal deviation, mucosal edema or rhinorrhea.     Right Turbinates: Enlarged, swollen and pale.     Left Turbinates: Enlarged, swollen and pale.     Right Sinus: No maxillary sinus tenderness or frontal sinus tenderness.     Left Sinus: No maxillary sinus tenderness or frontal sinus tenderness.     Mouth/Throat:     Lips: Pink.     Mouth: Mucous membranes are moist. Mucous membranes are not pale and not dry.     Pharynx: Uvula midline.     Comments: Minimal cobblestoning in the posterior oropharynx.   Eyes:     General: Lids are normal. Allergic shiner present.        Right eye: No discharge.        Left eye: No discharge.     Conjunctiva/sclera: Conjunctivae normal.     Right eye: Right conjunctiva is not injected. No chemosis.    Left eye: Left conjunctiva is not injected. No chemosis.    Pupils: Pupils are equal, round, and reactive to light.  Cardiovascular:     Rate and Rhythm: Normal rate and regular rhythm.     Heart sounds: Normal heart sounds.  Pulmonary:     Effort: Pulmonary effort is normal. No tachypnea, accessory muscle usage or respiratory distress.     Breath sounds:  Normal breath sounds. No wheezing, rhonchi or rales.     Comments: Moving air well in all lung fields. Chest:     Chest wall: No tenderness.  Abdominal:     Tenderness: There is no abdominal tenderness. There is no guarding or rebound.  Lymphadenopathy:     Head:     Right side of head: No submandibular, tonsillar or occipital adenopathy.     Left side of head: No submandibular, tonsillar or occipital adenopathy.     Cervical: No cervical adenopathy.  Skin:    Coloration: Skin is not pale.     Findings: No abrasion, erythema, petechiae or rash. Rash is not papular, urticarial or vesicular.  Neurological:     Mental Status: She is alert.     Diagnostic studies:    Allergy Studies:     Airborne Adult Perc - 07/25/21 1519     Time Antigen Placed 1500    Allergen Manufacturer Lavella Hammock    Location Back    Number of Test 59    1. Control-Buffer 50% Glycerol Negative    2. Control-Histamine 1 mg/ml 2+    3. Albumin saline Negative    4. [place] Negative    5. [place] Negative    6. Johnson Negative    7. [place] Blue Negative    8. Meadow Fescue Negative    9. Perennial Rye Negative    10. Sweet Vernal Negative    11. Timothy Negative    12. Cocklebur Negative    13. Burweed  Marshelder Negative    14. Ragweed, short Negative    15. Ragweed, Giant Negative    16. Plantain,  English Negative    17. Lamb's Quarters Negative    18. Sheep Sorrell Negative    19. Rough Pigweed Negative    20. Marsh Elder, Rough Negative    21. Mugwort, Common Negative    22. Ash mix Negative    23. Birch mix Negative    24. Beech American Negative    25. Box, Elder Negative    26. Cedar, red Negative    27. Cottonwood, [place] Negative    28. Elm mix Negative    29. Hickory Negative    30. Maple mix Negative    31. Oak, [place] mix Negative    32. Pecan Pollen Negative    33. Pine mix Negative    34. Sycamore Eastern Negative    35. [place], Black Pollen Negative    36. Alternaria  alternata Negative    37. Cladosporium Herbarum Negative    38. Aspergillus mix Negative    39. Penicillium mix Negative    40. Bipolaris sorokiniana (Helminthosporium) Negative    41. Drechslera spicifera (Curvularia) Negative    42. Mucor plumbeus Negative    43. Fusarium moniliforme Negative    44. Aureobasidium pullulans (pullulara) Negative    45. Rhizopus oryzae Negative    46. Botrytis cinera Negative    47. Epicoccum nigrum Negative    48. Phoma betae Negative    49. Candida Albicans Negative    50. Trichophyton mentagrophytes Negative    51. Mite, D Farinae  5,000 AU/ml Negative    52. Mite, D Pteronyssinus  5,000 AU/ml Negative    53. Cat Hair 10,000 BAU/ml Negative    54.  Dog Epithelia Negative    55. Mixed Feathers Negative    56. Horse Epithelia Negative    57. Cockroach, German Negative    58. Mouse Negative    59. Tobacco Leaf Negative             Food Adult Perc - 07/25/21 1500     Time Antigen Placed 1500    Allergen Manufacturer Greer    Location Back    Number of allergen test 72     Control-buffer 50% Glycerol Negative    Control-Histamine 1 mg/ml 2+    1. Peanut Negative    2. Soybean Negative    3. Wheat Negative    4. Sesame Negative    5. Milk, cow Negative    6. Egg White, Chicken Negative    7. Casein Negative    8. Shellfish Mix Negative    9. Fish Mix Negative    10. Cashew Negative    11. Pecan Food Negative    12. [place] Negative    13. Almond Negative    14. Hazelnut Negative    15. [place] nut Negative    16. Coconut Negative    17. Pistachio Negative    18. Catfish Negative    19. Bass Negative    20. Trout Negative    21. Tuna Negative    22. Salmon Negative    23. Flounder Negative    24. Codfish Negative    25. Shrimp Negative    26. Crab Negative    27. Lobster Negative    28. Oyster Negative    29. Scallops Negative    30. Barley Negative    31. Oat  Negative    32.  Rye  Negative    33. Hops Negative     34. Rice Negative    35. Cottonseed Negative    36. Saccharomyces Cerevisiae  Negative    37. Pork Negative    38. [place] Meat Negative    39. Chicken Meat Negative    40. Beef Negative    41. Lamb Negative    42. Tomato Negative    43. White Potato Negative    44. Sweet Potato Negative    45. Pea, Green/English Negative    46. Navy Bean Negative    47. Mushrooms Negative    48. Avocado Negative    49. Onion Negative    50. Cabbage Negative    51. Carrots Negative    52. Celery Negative    53. Corn Negative    54. Cucumber Negative    55. Grape (White seedless) Negative    56. Orange  Negative    57. Banana Negative    58. Apple Negative    59. Peach Negative    60. Strawberry Negative    61. Cantaloupe Negative    62. Watermelon Negative    63. Pineapple Negative    64. Chocolate/Cacao bean Negative    65. Karaya Gum Negative    66. Acacia (Arabic Gum) Negative    67. Cinnamon Negative    68. Nutmeg Negative    69. Ginger Negative    70. Garlic Negative    71. Pepper, black Negative    72. Mustard Negative             Allergy testing results were read and interpreted by myself, documented by clinical staff.         Salvatore Marvel, MD Allergy and [employer] [facility] [place]

## 2021-07-25 NOTE — Patient Instructions (Addendum)
1. Chronic rhinitis - Testing was negative to the entire panel. - Copy of testing results provided today. - I would try using Astelin two sprays per nostril twice daily during the summer months when you are outdoors and exposed to grass to see if this can help (this is a nasal antihistamine and although it tastes terrible, it works well).  2. Food intolerance - Testing was negative to the entire panel. - Copy of testing results provided. - There is a the low positive predictive value of food allergy testing and hence the high possibility of false positives. - In contrast, food allergy testing has a high negative predictive value, therefore if testing is negative we can be relatively assured that they are indeed negative.  - I do not think that you to have an EpiPen. - This still could be an intolerance, however.  - We are going to get an alpha gal panel to see where this is trending. - We are also going to get lab work to look for a mast cell disease (I think this is unlikely).  3. Likely allergic asthma - We are going to give you an albuterol inhaler to use as needed.  - You can even take two puffs before going out to [place].   4. Return in about 3 months (around 10/22/2021).    Please inform us of any Emergency Department visits, hospitalizations, or changes in symptoms. Call us before going to the ED for breathing or allergy symptoms since we might be able to fit you in for a sick visit. Feel free to contact us anytime with any questions, problems, or concerns.  It was a pleasure to meet you today!  Websites that have reliable patient information: 1. American Academy of Asthma, Allergy, and Immunology: www.aaaai.org 2. Food Allergy Research and Education (FARE): foodallergy.org 3. Mothers of Asthmatics: http://www.asthmacommunitynetwork.org 4. American College of Allergy, Asthma, and Immunology: www.acaai.org   COVID-19 Vaccine Information can be found at:  [email or web address] For questions related to vaccine distribution or appointments, please email [email or web address] or call [phone].   We realize that you might be concerned about having an allergic reaction to the COVID19 vaccines. To help with that concern, WE ARE OFFERING THE COVID19 VACCINES IN OUR OFFICE! Ask the front desk for dates!     Like [place] on [employer] and Instagram for our latest updates!      A healthy democracy works best when [employer] participate! Make sure you are registered to vote! If you have moved or changed any of your contact information, you will need to get this updated before voting!  In some cases, you MAY be able to register to vote online: [email or web address]        Airborne Adult Perc - 07/25/21 1519     Time Antigen Placed 1500    Allergen Manufacturer Lavella Hammock    Location Back    Number of Test 59    1. Control-Buffer 50% Glycerol Negative    2. Control-Histamine 1 mg/ml 2+    3. Albumin saline Negative    4. [place] Negative    5. [place] Negative    6. [place] Negative    7. [place] Blue Negative    8. Meadow Fescue Negative    9. Perennial Rye Negative    10. Sweet Vernal Negative    11. Timothy Negative    12. Cocklebur Negative    13. Burweed Marshelder Negative    14. Ragweed, short Negative    15. Ragweed, Giant Negative  16. Plantain,  English Negative    17. Lamb's Quarters Negative    18. Sheep Sorrell Negative    19. Rough Pigweed Negative    20. Marsh Elder, Rough Negative    21. Mugwort, Common Negative    22. Ash mix Negative    23. Birch mix Negative    24. Beech American Negative    25. Box, Elder Negative    26. Cedar, red Negative    27. Cottonwood, [place] Negative    28. Elm mix Negative    29. Hickory Negative    30. Maple mix Negative    31. Oak, [place] mix Negative    32. Pecan Pollen Negative    33. Pine mix  Negative    34. Sycamore Eastern Negative    35. [place], Black Pollen Negative    36. Alternaria alternata Negative    37. Cladosporium Herbarum Negative    38. Aspergillus mix Negative    39. Penicillium mix Negative    40. Bipolaris sorokiniana (Helminthosporium) Negative    41. Drechslera spicifera (Curvularia) Negative    42. Mucor plumbeus Negative    43. Fusarium moniliforme Negative    44. Aureobasidium pullulans (pullulara) Negative    45. Rhizopus oryzae Negative    46. Botrytis cinera Negative    47. Epicoccum nigrum Negative    48. Phoma betae Negative    49. Candida Albicans Negative    50. Trichophyton mentagrophytes Negative    51. Mite, D Farinae  5,000 AU/ml Negative    52. Mite, D Pteronyssinus  5,000 AU/ml Negative    53. Cat Hair 10,000 BAU/ml Negative    54.  Dog Epithelia Negative    55. Mixed Feathers Negative    56. Horse Epithelia Negative    57. Cockroach, German Negative    58. Mouse Negative    59. Tobacco Leaf Negative             Food Adult Perc - 07/25/21 1500     Time Antigen Placed 1500    Allergen Manufacturer Greer    Location Back    Number of allergen test 72     Control-buffer 50% Glycerol Negative    Control-Histamine 1 mg/ml 2+    1. Peanut Negative    2. Soybean Negative    3. Wheat Negative    4. Sesame Negative    5. Milk, cow Negative    6. Egg White, Chicken Negative    7. Casein Negative    8. Shellfish Mix Negative    9. Fish Mix Negative    10. Cashew Negative    11. Pecan Food Negative    12. [place] Negative    13. Almond Negative    14. Hazelnut Negative    15. [place] nut Negative    16. Coconut Negative    17. Pistachio Negative    18. Catfish Negative    19. Bass Negative    20. Trout Negative    21. Tuna Negative    22. Salmon Negative    23. Flounder Negative    24. Codfish Negative    25. Shrimp Negative    26. Crab Negative    27. Lobster Negative    28. Oyster Negative    29. Scallops  Negative    30. Barley Negative    31. Oat  Negative    32. Rye  Negative    33. Hops Negative    34. Rice Negative  35. Cottonseed Negative    36. Saccharomyces Cerevisiae  Negative    37. Pork Negative    38. [place] Meat Negative    39. Chicken Meat Negative    40. Beef Negative    41. Lamb Negative    42. Tomato Negative    43. White Potato Negative    44. Sweet Potato Negative    45. Pea, Green/English Negative    46. Navy Bean Negative    47. Mushrooms Negative    48. Avocado Negative    49. Onion Negative    50. Cabbage Negative    51. Carrots Negative    52. Celery Negative    53. Corn Negative    54. Cucumber Negative    55. Grape (White seedless) Negative    56. Orange  Negative    57. Banana Negative    58. Apple Negative    59. Peach Negative    60. Strawberry Negative    61. Cantaloupe Negative    62. Watermelon Negative    63. Pineapple Negative    64. Chocolate/Cacao bean Negative    65. Karaya Gum Negative    66. Acacia (Arabic Gum) Negative    67. Cinnamon Negative    68. Nutmeg Negative    69. Ginger Negative    70. Garlic Negative    71. Pepper, black Negative    72. Mustard Negative            Food Intolerance Versus Food Allergy  Food IntoleranceSome of the symptoms of food intolerance and food allergy are similar, but the differences between the two are very important. Eating a food you are intolerant to can leave you feeling miserable. However, if you have a true food allergy, your bodys reaction to this food could be life-threatening.  Digestive system versus immune system  A food intolerance response takes place in the digestive system. It occurs when you are unable to properly breakdown the food. This could be due to enzyme deficiencies, sensitivity to food additives or reactions to naturally occurring chemicals in foods. Often, people can eat small amounts of the food without causing problems.  A food allergic reaction involves  the immune system. Your immune system controls how your body defends itself. For instance, if you have an allergy to cows milk, your immune system identifies cows milk as an invader or allergen. Your immune system overreacts by producing antibodies called Immunoglobulin E (IgE). These antibodies travel to cells that release chemicals, causing an allergic reaction. Each type of IgE has a specific radar for each type of allergen.  Unlike an intolerance to food, a food allergy can cause a serious or even life-threatening reaction by eating a microscopic amount, touching or inhaling the food.  Symptoms of allergic reactions to foods are generally seen on the skin (hives, itchiness, swelling of the skin). Gastrointestinal symptoms may include vomiting and diarrhea. Respiratory symptoms may accompany skin and gastrointestinal symptoms, but dont usually occur alone.  Anaphylaxis (pronounced an-a-fi-LAK-sis) is a serious allergic reaction that happens very quickly. Symptoms of anaphylaxis may include difficulty breathing, dizziness or loss of consciousness. Without immediate treatment--an injection of epinephrine (adrenalin) and expert care--anaphylaxis can be fatal.  To the Point: there is a very serious difference between being intolerant to a food and having a food allergy.     Regarding Food IgG Testing: In IgG testing, the blood is tested for IgG antibodies instead of being tested for IgE antibodies (i.e., the antibodies typically associated with  food allergies). The existence of serum IgG antibodies towards particular foods is claimed by many practitioners as a tool to diagnose food allergy or intolerance. The problem with this is that IgG is a memory antibody. IgG signifies exposure to a food, not allergy to a food. Since a normal immune system should make IgG antibodies to foreign proteins, a positive IgG test to a food is a sign of a normal immune system. In fact, a positive result can actually  indicate tolerance for the food, not intolerance. There is no scientific evidence to support IgG testing for the diagnosis of food allergies.

## 2021-07-30 LAB — ALPHA-GAL PANEL
Allergen Lamb IgE: <0.1 kU/L
Beef IgE: <0.1 kU/L
IgE (Immunoglobulin E), Serum: 29 IU/mL (ref 6–495)
O215-IgE Alpha-Gal: 0.25 kU/L — AB
Pork IgE: <0.1 kU/L

## 2021-07-30 LAB — TRYPTASE: Tryptase: 8.4 ug/L (ref 2.2–13.2)

## 2021-07-31 ENCOUNTER — Encounter: Payer: Self-pay | Admitting: Allergy & Immunology

## 2021-08-16 ENCOUNTER — Telehealth: Payer: Self-pay | Admitting: Allergy & Immunology

## 2021-08-16 NOTE — Telephone Encounter (Signed)
PATIENT CALLED IN TO ASK IF SHE HAS A BALANCE. I SAID THERE IS A $50 BALANCE. PATIENT STATES THAT SHE PAID COPAY AT LAST VISIT. PATIENT WANTS THIS LOOKED INTO BECAUSE SHE BELIEVES SHE ALREADY PAID. ?

## 2021-08-31 ENCOUNTER — Ambulatory Visit (INDEPENDENT_AMBULATORY_CARE_PROVIDER_SITE_OTHER): Payer: Commercial Managed Care - PPO | Admitting: Gastroenterology

## 2021-08-31 ENCOUNTER — Other Ambulatory Visit: Payer: Self-pay

## 2021-08-31 ENCOUNTER — Encounter: Payer: Self-pay | Admitting: Gastroenterology

## 2021-08-31 VITALS — BP 130/80 | HR 93 | Temp 97.5°F | Ht 64.0 in | Wt 287.8 lb

## 2021-08-31 DIAGNOSIS — K219 Gastro-esophageal reflux disease without esophagitis: Secondary | ICD-10-CM

## 2021-08-31 DIAGNOSIS — R197 Diarrhea, unspecified: Secondary | ICD-10-CM | POA: Diagnosis not present

## 2021-08-31 MED ORDER — COLESTIPOL HCL 1 G PO TABS: 4.0000 g | ORAL_TABLET | Freq: Every day | ORAL | 3 refills | Status: AC

## 2021-08-31 NOTE — Progress Notes (Signed)
? ? ? ?GI Office Note   ? ?Referring Provider: Denny Levy, PA ?Primary Care Physician:  Denny Levy, [place]  ?Primary Gastroenterologist: Garfield Cornea, MD ? ? ?Chief Complaint  ? ?Chief Complaint  ?Patient presents with  ? Follow-up  ?  Still having chronic diarrhea  ? ? ?History of Present Illness  ? ?Cheyenne Hamilton is a 56 y.o. female presenting today for follow-up of chronic diarrhea, chronic GERD.  Also with history of C. difficile colitis 2022.  Last seen in the office in October 2022. ? ?Since her last office visit, she completed colonoscopy/EGD.  Findings as outlined below.  She had reactive gastropathy, benign duodenal biopsies.  Segmental colonic biopsies were benign as well.  No evidence of increased eosinophils, microscopic colitis, celiac disease.  Celiac serologies were negative.  Alpha-gal panel was positive for alpha gal IgE, 0.25.  Beef/lamb/pork IgE all negative.  CBC showed absolute eosinophils slightly elevated at 694. ? ?She saw allergist last month, food allergy testing panel was negative.  Testing for mast cell disease and trending of alpha gal panel planned.  Alpha gal showed 0.25 IgE, less than 0.1 for beef, pork, lamb. Tryptase was normal.  Patient stopped eating all red meat for 1 month but noted no improvement in her diarrhea. ? ?She has had chronic diarrhea since her cholecystectomy in 1987.  Initially was told she had IBS.  Remotely diagnosed with H. pylori, treated.  She had an EGD and colonoscopy at age 47, records not available.  Several years back she tried Questran but did not tolerate the texture/taste.  In July 2022 she felt like her chronic diarrhea worsened.  Stool studies was positive for C. difficile, she was treated and did improve.  4 weeks later she had recurrence of symptoms, repeat C. difficile was negative.  H. pylori stool antigen was negative although she did not stop PPI therapy.  She believes she stopped her PPI prior to endoscopy but she did have to take  antibiotics prior to it.  We discussed potential false negative results but she does not want to pursue H. pylori antigen PPI at this time which I feel is reasonable.  ? ?Today: Patient states her diarrhea really is unchanged.  It is not bad like it was when she had C. difficile.  She believes she is lactose intolerant, days that she chooses to drink coffee with creamer her diarrhea is worse.  Drinks only fair life milk which is lactose-free.  She feels like her diarrhea still runs her life.  Anytime she eats she has postprandial stool before she can even finish her meal.  She has to arrange her life around her bowel movements.  No melena or rectal bleeding.  Denies any abdominal pain associated with it.  Reflux is well controlled as long she takes her omeprazole.  If she misses a dose or 2 then she has recurrent significant symptoms which can last for days.  Denies any dysphagia.  Vomiting.  No unintentional weight loss.  No aspirin powders.  Patient is taking Lomotil provided by her PCP.  She takes 1 in the morning on days that she has to go to work or is planning to leave the house. Notes that it does help quite a bit.  Does not prevent postprandial stools so she often goes without eating until she knows she will have access to a bathroom.  If she takes Lomotil 1 day, the following day she will start out with a solid stool otherwise her  stools are pretty much watery.  Occasionally has taken 2 Lomotil in a day. ?   ? ?EGD November 2022: ?- Normal esophagus. ?- Mucosal changes in the stomach as described?status post biopsy ?- Normal duodenal bulb, second portion of the duodenum and third portion of the ?duodenum. Status post duodenal biopsy ?-Duodenal biopsies benign ?-Gastric biopsy showed reactive gastropathy with features consistent with PPI effect, no H. pylori. ? ?Colonoscopy November 2022: ?-Diverticulosis in the entire examined colon. ?- Non-bleeding internal hemorrhoids. ?- The examination was otherwise  normal on direct and retroflexion views. ?- terminal ileum could not be intubated due to angulation ?- Status post segmental biopsy, benign colonic mucosa ?-Next colonoscopy in 5 years ? ? ? ?Medications  ? ?Current Outpatient Medications  ?Medication Sig Dispense Refill  ? aspirin EC 81 MG tablet Take 81 mg by mouth at bedtime. Swallow whole.    ? clobetasol cream (TEMOVATE) 1.19 % Apply 1 application topically 2 (two) times daily as needed (irritation).    ? clonazePAM (KLONOPIN) 0.5 MG tablet Take 1 mg by mouth at bedtime.    ? diclofenac Sodium (VOLTAREN) 1 % GEL Apply 2 g topically 4 (four) times daily as needed (pain).    ? diphenoxylate-atropine (LOMOTIL) 2.5-0.025 MG tablet Take 1 tablet by mouth daily.    ? escitalopram (LEXAPRO) 10 MG tablet Take 10 mg by mouth every morning.     ? folic acid (FOLVITE) 1 MG tablet Take 1 mg by mouth every morning.     ? furosemide (LASIX) 20 MG tablet Take 20 mg by mouth every morning.     ? methotrexate (RHEUMATREX) 2.5 MG tablet Take 15 mg by mouth every Monday. 6 pills every monday     ? omeprazole (PRILOSEC) 20 MG capsule Take 20 mg by mouth every morning.     ? rosuvastatin (CRESTOR) 5 MG tablet Take 5 mg by mouth every morning.     ? Semaglutide,0.25 or 0.'5MG'$ /DOS, (OZEMPIC, 0.25 OR 0.5 MG/DOSE,) 2 MG/1.5ML SOPN Inject 0.5 mg into the skin every Sunday.    ? ?No current facility-administered medications for this visit.  ? ? ?Allergies  ? ?Allergies as of 08/31/2021 - Review Complete 08/31/2021  ?Allergen Reaction Noted  ? Hydrocodone Other (See Comments) 03/30/2012  ? ?  ?Review of Systems  ? ?General: Negative for anorexia, weight loss, fever, chills, fatigue, weakness. ?ENT: Negative for hoarseness, difficulty swallowing , nasal congestion. ?CV: Negative for chest pain, angina, palpitations, dyspnea on exertion, peripheral edema.  ?Respiratory: Negative for dyspnea at rest, dyspnea on exertion, cough, sputum, wheezing.  ?GI: See history of present illness. ?GU:   Negative for dysuria, hematuria, urinary incontinence, urinary frequency, nocturnal urination.  ?Endo: Negative for unusual weight change.  ?   ?Physical Exam  ? ?BP 130/80 (BP Location: Right Arm, Patient Position: Sitting, Cuff Size: Large)   Pulse 93   Temp (!) 97.5 ?F (36.4 ?C) (Temporal)   Ht '5\' 4"'$  (1.626 m)   Wt 287 lb 12.8 oz (130.5 kg)   SpO2 95%   BMI 49.40 kg/m?  ?  ?General: Well-nourished, well-developed in no acute distress.  ?Eyes: No icterus. ?Mouth: masked ?Abdomen: Bowel sounds are normal, nontender, nondistended, no hepatosplenomegaly or masses,  ?no abdominal bruits or hernia , no rebound or guarding.  ?Rectal: not performed  ?Extremities: No lower extremity edema. No clubbing or deformities. ?Neuro: Alert and oriented x 4   ?Skin: Warm and dry, no jaundice.   ?Psych: Alert and cooperative, normal mood and  affect. ? ?Labs  ? ?Lab Results  ?Component Value Date  ? CREATININE 0.73 03/22/2021  ? BUN 9 03/22/2021  ? NA 140 03/22/2021  ? K 4.0 03/22/2021  ? CL 101 03/22/2021  ? CO2 32 03/22/2021  ? ?Lab Results  ?Component Value Date  ? ALT 17 03/22/2021  ? AST 17 03/22/2021  ? ALKPHOS 77 04/19/2020  ? BILITOT 0.5 03/22/2021  ? ?Lab Results  ?Component Value Date  ? WBC 10.2 03/22/2021  ? HGB 14.4 03/22/2021  ? HCT 43.3 03/22/2021  ? MCV 94.5 03/22/2021  ? PLT 317 03/22/2021  ? ? ?Imaging Studies  ? ?No results found. ? ?Assessment  ? ?Chronic diarrhea: Notably since cholecystectomy in the 80s but worse over the past 3 to 4 years.  Symptoms controlling her life.  Colonoscopy in 2018 with random colon biopsies which were negative.  Recent colonoscopy again with negative random colon biopsies, no evidence of increased eosinophils.  Gastric and duodenal biopsies also with no evidence of increased eosinophils or evidence of celiac.  Celiac serologies negative.  Alpha gal panel with mildly elevated alpha gal IgE.  Patient has been avoiding red meat for 1 month with no change in the symptoms.  Chronic  diarrhea complicated by C. difficile last year but she did improve with antibiotic therapy.  Several weeks later she had worsening symptoms and had a repeat C. difficile which was negative.  She has noted the most

## 2021-08-31 NOTE — Patient Instructions (Signed)
RX for Colestid sent to pharmacy. I wrote it for 4 grams (4 capsules) once daily. You can start with 2 or 3 capsules to see if that works well enough and if not, go up to 4 capsules. Try to hold off on Lomotil until we can see if Colestid will work. ?Continue omeprazole daily. ?Please let me know how you are doing in a couple of weeks.  ? ?

## 2022-01-16 ENCOUNTER — Encounter (INDEPENDENT_AMBULATORY_CARE_PROVIDER_SITE_OTHER): Payer: Self-pay

## 2022-05-10 IMAGING — MR MR MRA HEAD W/O CM
1 series · 17 of 48 positions shown · non-contrast
Comparison: None.

CLINICAL DATA: Right-sided weakness for 2 days

EXAM:
MRA HEAD WITHOUT CONTRAST
TECHNIQUE: Angiographic images of the [place] were obtained using MRA
technique without intravenous contrast.

[Series 1: TOF fat-sat · axial · 0.8mm · 0.34mm/px · z∈[-45,+53]mm · 17 of 131 slices shown]
[im 1/131]
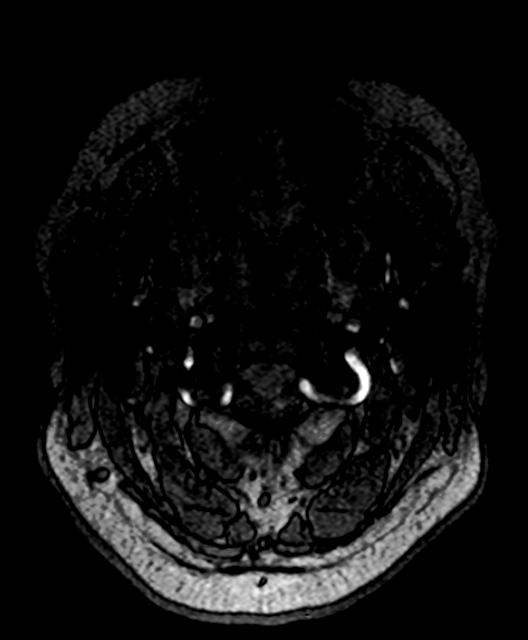
[im 3/131]
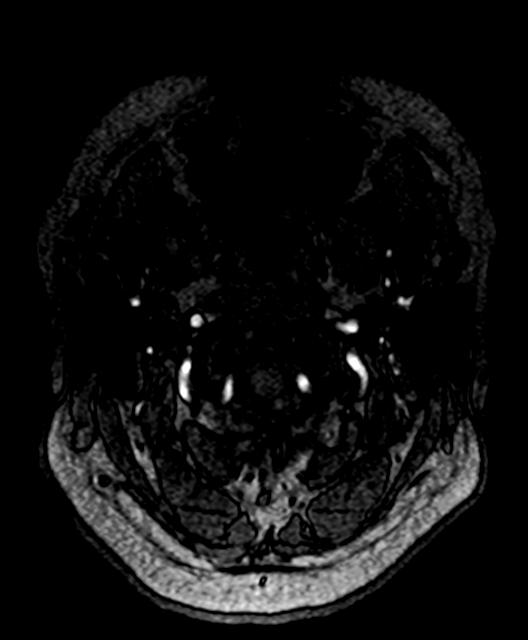
[im 6/131]
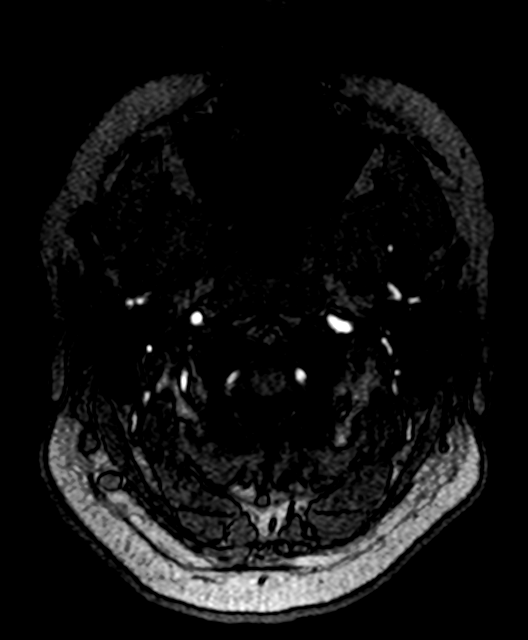
[im 9/131]
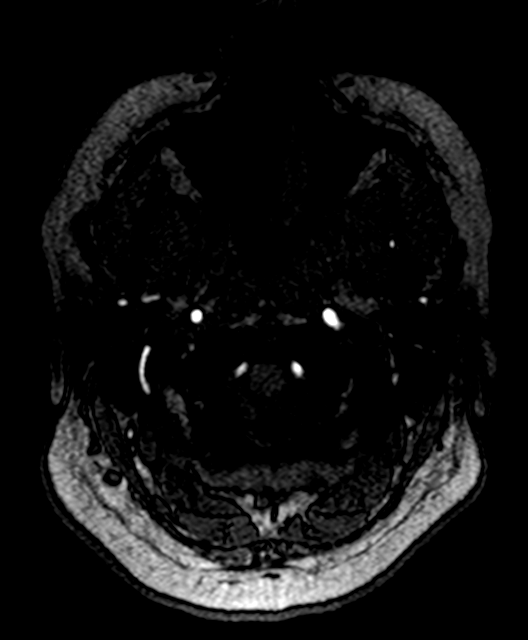
[im 12/131]
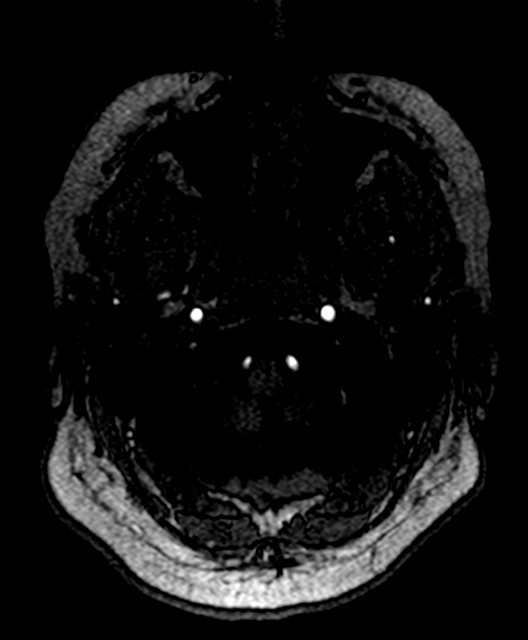
[im 14/131]
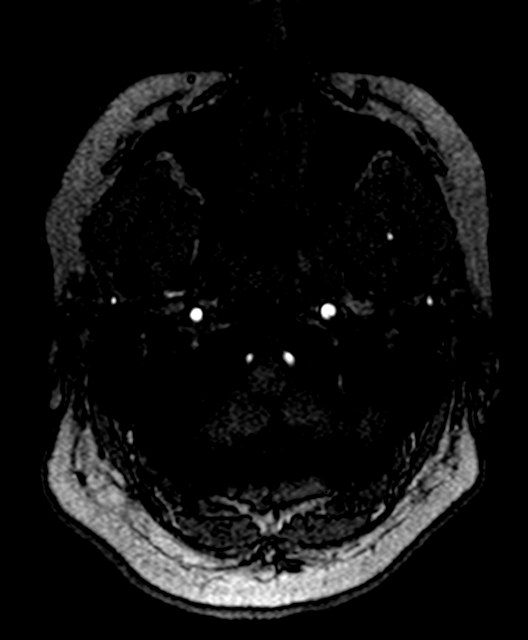
[im 17/131]
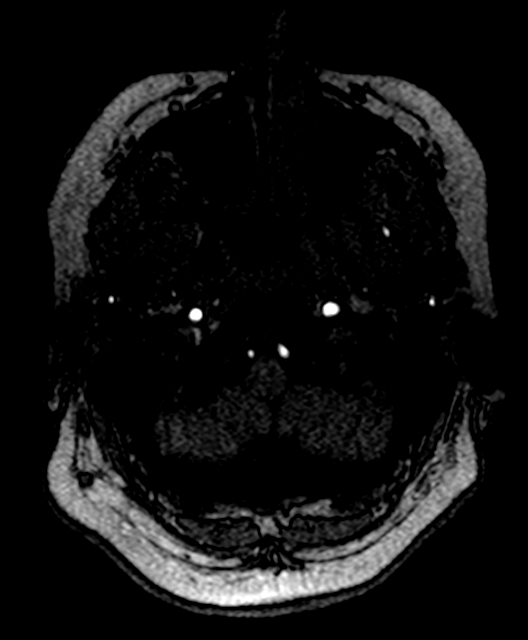
[im 23/131]
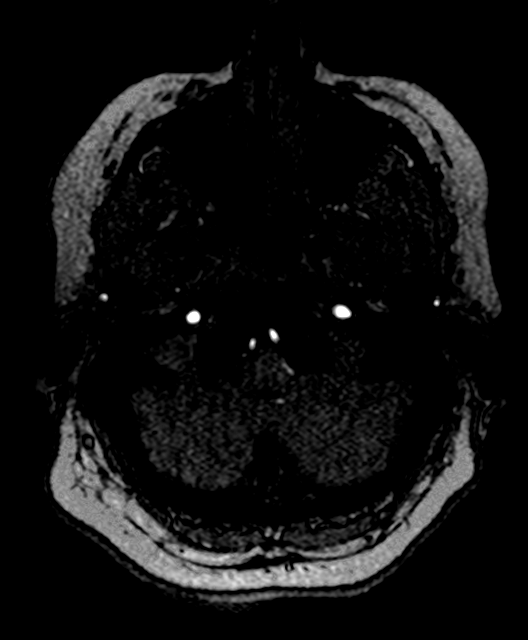
[im 25/131]
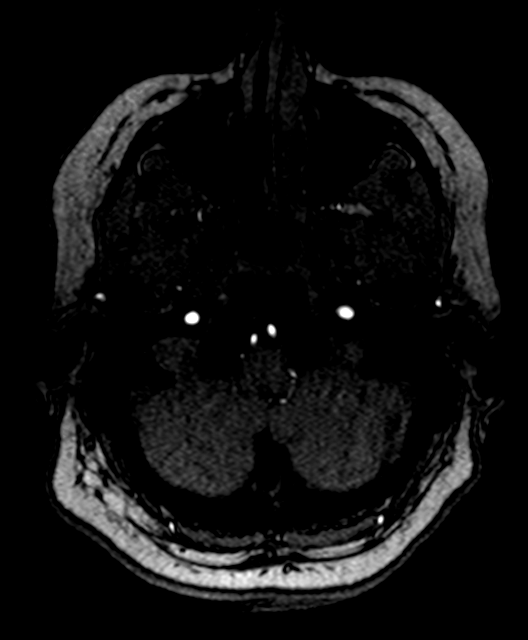
[im 42/131]
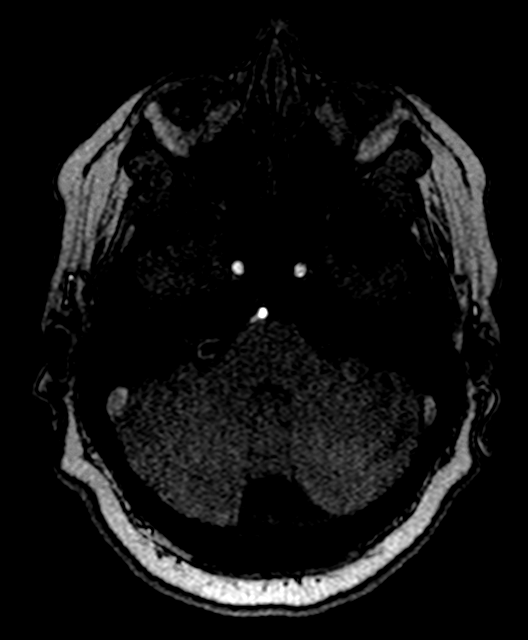
[im 59/131]
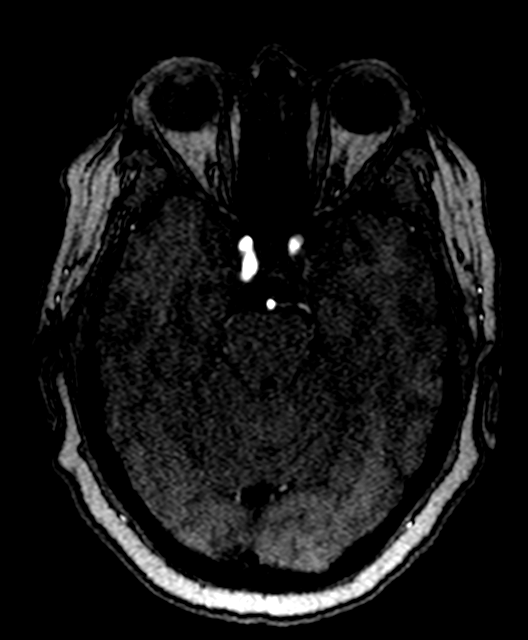
[im 67/131]
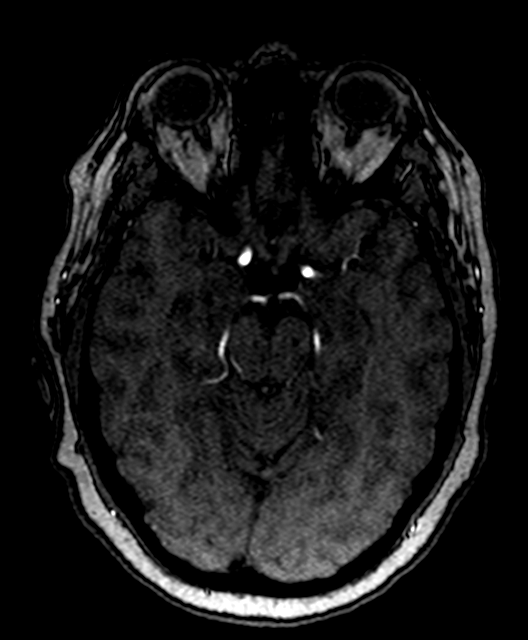
[im 75/131]
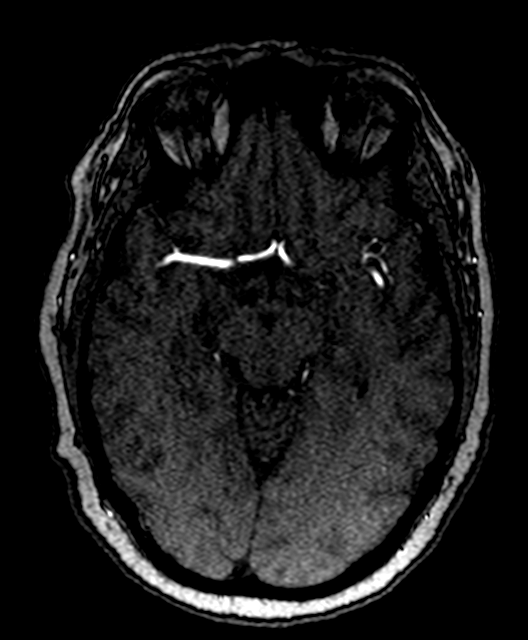
[im 92/131]
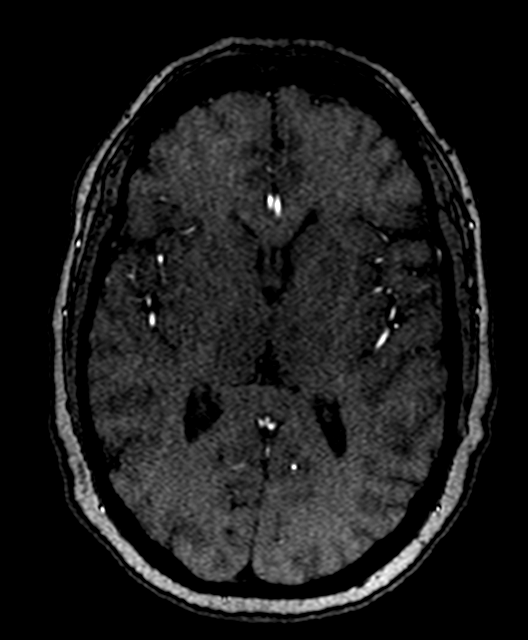
[im 108/131]
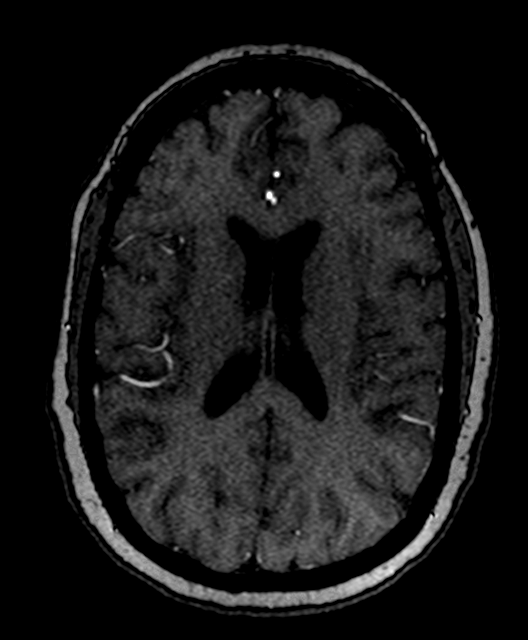
[im 111/131]
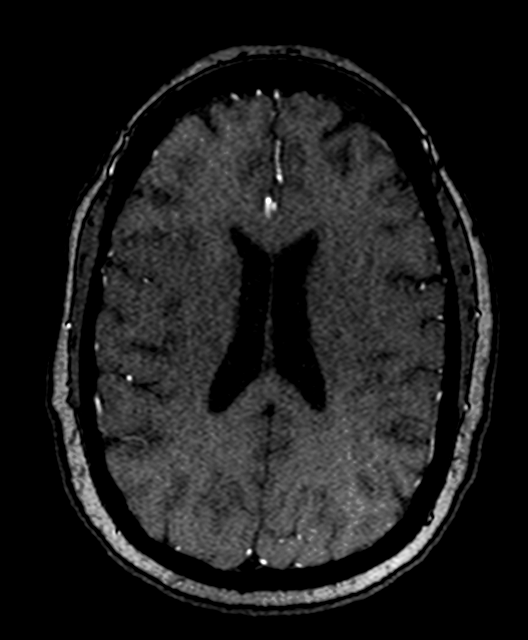
[im 125/131]
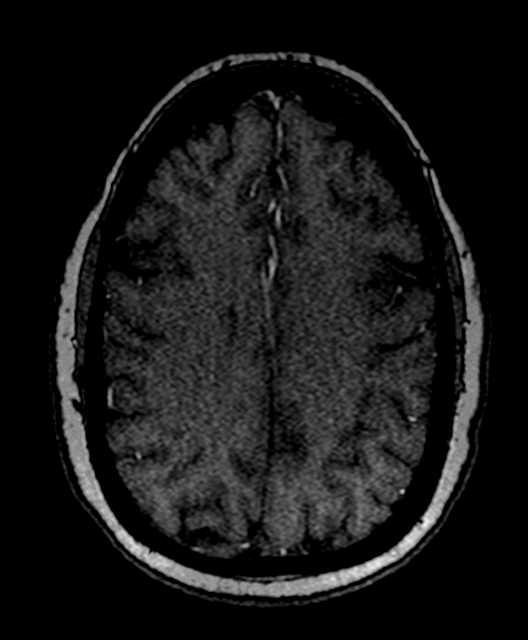

[17 of 48 positions shown; findings below may reference images not displayed]

FINDINGS: Unremarkable intracranial anatomy. Vessels are smooth and widely
patent. No branch occlusion, beading, or aneurysm.
IMPRESSION: Negative intracranial MRA.

## 2022-07-24 ENCOUNTER — Ambulatory Visit: Payer: Commercial Managed Care - PPO | Admitting: Internal Medicine

## 2022-09-04 ENCOUNTER — Ambulatory Visit: Payer: 59 | Attending: Cardiology | Admitting: Cardiology

## 2022-09-04 ENCOUNTER — Encounter: Payer: Self-pay | Admitting: Cardiology

## 2022-09-04 VITALS — BP 128/84 | HR 95 | Ht 64.0 in | Wt 264.2 lb

## 2022-09-04 DIAGNOSIS — R002 Palpitations: Secondary | ICD-10-CM

## 2022-09-04 NOTE — Progress Notes (Signed)
Clinical Summary Cheyenne Hamilton is a 57 y.o.female seen today as a new consult, referred by [facility] for the following medical problem.s  1.Palpitations - from pcp note persistent palpitations since having covid, diagnosed around Thanksgiving - symptoms lasted all December and into January. Went away a week in January then reoccurred for few days, no symptoms since January - brief episodes of fluttering, would occur at rest or with activity.  - was treated for covid molnuparavir - 5 days of prednisone for a rash, not sure timing  - limited caffeine, no EtoH - thyroid was normal    2. Aortic atherosclerosis - noted CT scan - pcp started statin  3. Obesity On ozempic, down 40 lbs.   SH: works as Copywriter, advertising Past Medical History:  Diagnosis Date   Acid reflux    Anxiety    Back pain    Complication of anesthesia    Hard to wake up   Depression    GERD (gastroesophageal reflux disease)    Glaucoma    High triglycerides    Hyperlipidemia    IBS (irritable bowel syndrome)    Knee pain    Lower extremity edema    Ocular migraine    Palpitations    PCOS (polycystic ovarian syndrome)    Pre-diabetes    Prediabetes    Psoriasis    Sleep apnea      Allergies  Allergen Reactions   Hydrocodone Other (See Comments)    Pt states made her have a rapid heart beat     Current Outpatient Medications  Medication Sig Dispense Refill   aspirin EC 81 MG tablet Take 81 mg by mouth at bedtime. Swallow whole.     clobetasol cream (TEMOVATE) [id number] % Apply 1 application topically 2 (two) times daily as needed (irritation).     clonazePAM (KLONOPIN) 0.5 MG tablet Take 1 mg by mouth at bedtime.     colestipol (COLESTID) 1 g tablet Take 4 tablets (4 g total) by mouth daily. Do not take within 2 hours of other medications. 120 tablet 3   diclofenac Sodium (VOLTAREN) 1 % GEL Apply 2 g topically 4 (four) times daily as needed (pain).     diphenoxylate-atropine (LOMOTIL)  2.5-0.025 MG tablet Take 1 tablet by mouth daily. Takes as needed.     escitalopram (LEXAPRO) 10 MG tablet Take 10 mg by mouth every morning.      folic acid (FOLVITE) 1 MG tablet Take 1 mg by mouth every morning.      furosemide (LASIX) 20 MG tablet Take 20 mg by mouth every morning.      methotrexate (RHEUMATREX) 2.5 MG tablet Take 15 mg by mouth every Monday. 6 pills every monday      omeprazole (PRILOSEC) 20 MG capsule Take 20 mg by mouth every morning.      rosuvastatin (CRESTOR) 5 MG tablet Take 5 mg by mouth every morning.      Semaglutide,0.25 or 0.5MG /DOS, (OZEMPIC, 0.25 OR 0.5 MG/DOSE,) 2 MG/1.5ML SOPN Inject 0.5 mg into the skin every Sunday.     No current facility-administered medications for this visit.     Past Surgical History:  Procedure Laterality Date   BIOPSY  04/18/2017   Procedure: BIOPSY;  Surgeon: Daneil Dolin, MD;  [facility];  Service: Endoscopy;;  ascending/descending/sigmoid   BIOPSY  04/19/2021   Procedure: BIOPSY;  Surgeon: Daneil Dolin, MD;  Location: [facility];  Service: Endoscopy;;  CERVICAL CERCLAGE     CHOLECYSTECTOMY     COLONOSCOPY WITH PROPOFOL N/A 04/18/2017   Procedure: COLONOSCOPY WITH PROPOFOL;  Surgeon: Daneil Dolin, MD;  [facility]: [facility];  Service: Endoscopy;  Laterality: N/A;  7:30am   COLONOSCOPY WITH PROPOFOL N/A 04/19/2021   Procedure: COLONOSCOPY WITH PROPOFOL;  Surgeon: Daneil Dolin, MD;  Location: [facility];  Service: Endoscopy;  Laterality: N/A;  9:15am (pt requests pre-op on 11/9 after 10:15am if possible)   ESOPHAGOGASTRODUODENOSCOPY (EGD) WITH PROPOFOL N/A 04/19/2021   Procedure: ESOPHAGOGASTRODUODENOSCOPY (EGD) WITH PROPOFOL;  Surgeon: Daneil Dolin, MD;  Location: [facility];  Service: Endoscopy;  Laterality: N/A;   POLYPECTOMY  04/18/2017   Procedure: POLYPECTOMY;  Surgeon: Daneil Dolin, MD;  [facility];  Service: Endoscopy;;  transverse colon     Allergies  Allergen  Reactions   Hydrocodone Other (See Comments)    Pt states made her have a rapid heart beat      Family History  Problem Relation Age of Onset   Arthritis Other    Hypertension Mother    Thyroid disease Mother    Obesity Mother    Anxiety disorder Mother    Hypertension Father    Obesity Father      Social History Ms. Laske reports that she has been smoking cigarettes. She has a 22.50 pack-year smoking history. She has never used smokeless tobacco. Ms. Dechristopher reports no history of alcohol use.   Review of Systems CONSTITUTIONAL: No weight loss, fever, chills, weakness or fatigue.  HEENT: Eyes: No visual loss, blurred vision, double vision or yellow sclerae.No hearing loss, sneezing, congestion, runny nose or sore throat.  SKIN: No rash or itching.  CARDIOVASCULAR: per hpi RESPIRATORY: No shortness of breath, cough or sputum.  GASTROINTESTINAL: No anorexia, nausea, vomiting or diarrhea. No abdominal pain or blood.  GENITOURINARY: No burning on urination, no polyuria NEUROLOGICAL: No headache, dizziness, syncope, paralysis, ataxia, numbness or tingling in the extremities. No change in bowel or bladder control.  MUSCULOSKELETAL: No muscle, back pain, joint pain or stiffness.  LYMPHATICS: No enlarged nodes. No history of splenectomy.  PSYCHIATRIC: No history of depression or anxiety.  ENDOCRINOLOGIC: No reports of sweating, cold or heat intolerance. No polyuria or polydipsia.  Marland Kitchen   Physical Examination Today's Vitals   09/04/22 1510  BP: 128/84  Pulse: 95  SpO2: 96%  Weight: 264 lb 3.2 oz (119.8 kg)  Height: 5\' 4"  (1.626 m)   Body mass index is 45.35 kg/m.  Gen: resting comfortably, no acute distress HEENT: no scleral icterus, pupils equal round and reactive, no palptable cervical adenopathy,  CV: RRR, no m/rg, no jvd Resp: Clear to auscultation bilaterally GI: abdomen is soft, non-tender, non-distended, normal bowel sounds, no hepatosplenomegaly MSK: extremities  are warm, no edema.  Skin: warm, no rash Neuro:  no focal deficits Psych: appropriate affect      Assessment and Plan  1.Palpitations - started after covid infection, now resolved - likely some self limited ectopy secondary to COVID - baseline EKG shows NSR from pcp - no further workup indicated given resolution of symptoms, if recurrence could plan outpatient monitor  2. Aortic atherosclerosis - agree with statin, ASA      Arnoldo Lenis, M.D.

## 2022-09-04 NOTE — Patient Instructions (Signed)
Medication Instructions:  Your physician recommends that you continue on your current medications as directed. Please refer to the Current Medication list given to you today.  *If you need a refill on your cardiac medications before your next appointment, please call your pharmacy*   Lab Work: None If you have labs (blood work) drawn today and your tests are completely normal, you will receive your results only by: [place] (if you have MyChart) OR A paper copy in the mail If you have any lab test that is abnormal or we need to change your treatment, we will call you to review the results.   Testing/Procedures: None   Follow-Up: At [facility], you and your health needs are our priority.  As part of our continuing mission to provide you with exceptional heart care, we have created designated Provider Care Teams.  These Care Teams include your primary Cardiologist (physician) and Advanced Practice Providers (APPs -  Physician Assistants and Nurse Practitioners) who all work together to provide you with the care you need, when you need it.  We recommend signing up for the patient portal called "MyChart".  Sign up information is provided on this After Visit Summary.  MyChart is used to connect with patients for Virtual Visits (Telemedicine).  Patients are able to view lab/test results, encounter notes, upcoming appointments, etc.  Non-urgent messages can be sent to your provider as well.   To learn more about what you can do with MyChart, go to [email or web address].    Your next appointment:   Follow up with Dr. Harl Bowie as needed.  Other Instructions .

## 2022-11-15 ENCOUNTER — Other Ambulatory Visit: Payer: Self-pay

## 2022-11-15 ENCOUNTER — Emergency Department (HOSPITAL_COMMUNITY): Payer: 59

## 2022-11-15 ENCOUNTER — Emergency Department (HOSPITAL_COMMUNITY)
Admission: EM | Admit: 2022-11-15 | Discharge: 2022-11-15 | Disposition: A | Discharge status: Home / Self Care | Payer: 59 | Attending: Emergency Medicine | Admitting: Emergency Medicine

## 2022-11-15 DIAGNOSIS — E119 Type 2 diabetes mellitus without complications: Secondary | ICD-10-CM | POA: Diagnosis not present

## 2022-11-15 DIAGNOSIS — R519 Headache, unspecified: Secondary | ICD-10-CM

## 2022-11-15 DIAGNOSIS — Z7982 Long term (current) use of aspirin: Secondary | ICD-10-CM | POA: Insufficient documentation

## 2022-11-15 LAB — SEDIMENTATION RATE: Sed Rate: 2 mm/hr (ref 0–22)

## 2022-11-15 LAB — MAGNESIUM: Magnesium: 2.1 mg/dL (ref 1.7–2.4)

## 2022-11-15 MED ORDER — DIPHENHYDRAMINE HCL 50 MG/ML IJ SOLN
25.0000 mg | Freq: Once | INTRAMUSCULAR | Status: AC
  Administered 2022-11-15: 25 mg via INTRAVENOUS
  Filled 2022-11-15: qty 1

## 2022-11-15 MED ORDER — KETOROLAC TROMETHAMINE 30 MG/ML IJ SOLN
30.0000 mg | Freq: Once | INTRAMUSCULAR | Status: AC
  Administered 2022-11-15: 30 mg via INTRAVENOUS
  Filled 2022-11-15: qty 1

## 2022-11-15 MED ORDER — PROCHLORPERAZINE EDISYLATE 10 MG/2ML IJ SOLN
10.0000 mg | Freq: Once | INTRAMUSCULAR | Status: AC
  Administered 2022-11-15: 10 mg via INTRAVENOUS
  Filled 2022-11-15: qty 2

## 2022-11-15 MED ORDER — SODIUM CHLORIDE 0.9 % IV BOLUS
1000.0000 mL | Freq: Once | INTRAVENOUS | Status: AC
  Administered 2022-11-15: 1000 mL via INTRAVENOUS

## 2022-11-15 NOTE — Discharge Instructions (Addendum)
Follow-up with your primary care provider for recheck.  You have also been given follow-up information for a neurologist.  Please contact to make an appointment.

## 2022-11-15 NOTE — ED Provider Notes (Signed)
EMERGENCY DEPARTMENT AT [facility] Provider Note   CSN: [id number] Arrival date & time: 11/15/22  1638     History  Chief Complaint  Patient presents with   Headache    Cheyenne Hamilton is a 57 y.o. female.  Has PMH of sleep apnea, diabetes, psoriasis, migraine, who presents the ER for 4 days of headache, located in the frontal area on top of her head. Been out for 4 days, it has been constant, states is about 3 out of 10 when she sitting still when she gets up and walks she gets a sudden worsening of her pain located to 10 out of 10 on the pain scale.  No vision changes, no vision loss, no jaw claudication but does state her whole scalp feels tender all over.  No numbness tingling or weakness.  She does admit to walk because of vomiting with some nausea.  She is unsure if it is related but had a root canal about 2 weeks ago and has some persistent numbness of her lower chin, thought to be a nerve entrapment by her dentist.  Fevers or chills, no neck stiffness     Headache      Home Medications Prior to Admission medications   Medication Sig Start Date End Date Taking? Authorizing Provider  aspirin EC 81 MG tablet Take 81 mg by mouth at bedtime. Swallow whole.    [provider]  clobetasol cream (TEMOVATE) 0.05 % Apply 1 application topically 2 (two) times daily as needed (irritation).    [provider]  clonazePAM (KLONOPIN) 0.5 MG tablet Take 1 mg by mouth at bedtime.    [provider]  colestipol (COLESTID) 1 g tablet Take 4 tablets (4 g total) by mouth daily. Do not take within 2 hours of other medications. Patient not taking: Reported on 09/04/2022 08/31/21   Tiffany Kocher, PA-C  diclofenac Sodium (VOLTAREN) 1 % GEL Apply 2 g topically 4 (four) times daily as needed (pain).    [provider]  diphenoxylate-atropine (LOMOTIL) 2.5-0.025 MG tablet Take 1 tablet by mouth daily. Takes as needed. Patient not taking:  Reported on 09/04/2022    [provider]  escitalopram (LEXAPRO) 10 MG tablet Take 10 mg by mouth every morning.  12/04/19   [provider]  folic acid (FOLVITE) 1 MG tablet Take 1 mg by mouth every morning.     [provider]  furosemide (LASIX) 20 MG tablet Take 20 mg by mouth every morning.     [provider]  methotrexate (RHEUMATREX) 2.5 MG tablet Take 15 mg by mouth every Monday. 6 pills every monday     [provider]  metroNIDAZOLE (METROCREAM) 0.75 % cream Apply 1 Application topically daily. 08/21/22   [provider]  omeprazole (PRILOSEC) 20 MG capsule Take 20 mg by mouth every morning.  12/04/19   [provider]  OZEMPIC, 2 MG/DOSE, 8 MG/3ML SOPN Inject 2 mg into the skin once a week. On Sunday 08/26/22   [provider]  rosuvastatin (CRESTOR) 5 MG tablet Take 5 mg by mouth every morning.  12/04/19   [provider]  Semaglutide,0.25 or 0.5MG /DOS, (OZEMPIC, 0.25 OR 0.5 MG/DOSE,) 2 MG/1.5ML SOPN Inject 0.5 mg into the skin every Sunday. Patient not taking: Reported on 09/04/2022    [provider]  Vitamin D, Ergocalciferol, (DRISDOL) 1.25 MG (50000 UNIT) CAPS capsule Take 50,000 Units by mouth once a week. Patient not taking: Reported on  09/04/2022 07/18/22   [provider]      Allergies    Hydrocodone    Review of Systems   Review of Systems  Neurological:  Positive for headaches.    Physical Exam Updated Vital Signs BP 135/85 (BP Location: Right Arm)   Pulse (!) 107   Temp 98.7 F (37.1 C) (Oral)   Resp 16   SpO2 93%  Physical Exam Vitals and nursing note reviewed.  Constitutional:      General: She is not in acute distress.    Appearance: She is well-developed.  HENT:     Head: Normocephalic and atraumatic.  Eyes:     General: No visual field deficit.    Conjunctiva/sclera: Conjunctivae normal.  Cardiovascular:     Rate and Rhythm: Normal rate and regular rhythm.      Heart sounds: No murmur heard. Pulmonary:     Effort: Pulmonary effort is normal. No respiratory distress.     Breath sounds: Normal breath sounds.  Abdominal:     Palpations: Abdomen is soft.     Tenderness: There is no abdominal tenderness.  Musculoskeletal:        General: No swelling.     Cervical back: Neck supple.  Skin:    General: Skin is warm and dry.     Capillary Refill: Capillary refill takes less than 2 seconds.  Neurological:     Mental Status: She is alert and oriented to person, place, and time.     GCS: GCS eye subscore is 4. GCS verbal subscore is 5. GCS motor subscore is 6.     Cranial Nerves: No dysarthria or facial asymmetry.     Sensory: No sensory deficit.     Motor: No weakness.     Coordination: Coordination normal.     Comments: Patient has to grab onto my arm for assistance doing heel-to-toe walk but otherwise can ambulate without significant difficulty  Psychiatric:        Mood and Affect: Mood is anxious.        Speech: Speech normal.        Behavior: Behavior normal.     ED Results / Procedures / Treatments   Labs (all labs ordered are listed, but only abnormal results are displayed) Labs Reviewed  SEDIMENTATION RATE  MAGNESIUM    EKG None  Radiology No results found.  Procedures Procedures    Medications Ordered in ED Medications  diphenhydrAMINE (BENADRYL) injection 25 mg (has no administration in time range)  prochlorperazine (COMPAZINE) injection 10 mg (has no administration in time range)  sodium chloride 0.9 % bolus 1,000 mL (has no administration in time range)    ED Course/ Medical Decision Making/ A&P                             Medical Decision Making [id number], tension headache, Intracranial hemorrhage, cluster headache, medication overuse headache, intracranial mass, temporal arteritis, other  ED course: Patient presents to the ER complaining of headache for 4 days it is worse with walking and feeling  somewhat off balance.  Remainder of her neurologic exam is normal.  I reviewed her previous records, she has had a prior MRI and MRA of her brain for a TIA workup that was normal.  She does have history of ocular migraines though not having vision changes today. Plan at this time is to treat with migraine cocktail minus Toradol and get a CT of her head,  reassess.  ESR also ordered due to scalp tenderness and age over 60 to rule out early temporal arteritis, though of note she is not having any vision loss or jaw claudication  Her imaging is pending, she has not been giving her medications yet, signed at this time to [employer], [place].   Amount and/or Complexity of Data Reviewed Labs: ordered. Radiology: ordered.  Risk Prescription drug management.           Final Clinical Impression(s) / ED Diagnoses Final diagnoses:  None    Rx / DC Orders ED Discharge Orders     None         Josem Kaufmann 11/15/22 1911    Benjiman Core, MD 11/15/22 2348

## 2022-11-15 NOTE — ED Triage Notes (Signed)
Pt reports HA since Monday . Sent here from [facility] for eval and further work up for same.  Pt reports worse with movement and worse than previous headaches.  No hx of migraine.  Some nausea on Wednesday with an episode of vomiting.

## 2022-11-15 NOTE — ED Provider Notes (Signed)
   Patient signed out to me pending lab and CT results.    Patient here for evaluation of persistent headache for 4 days.  Describes throbbing headache that is bitemporal and radiates to the right side of her head.  Reports some nausea with 1 episode of vomiting 2 days ago.  No photophobia.   See previous provider note for complete H&P   CT head without acute findings, magnesium and sed rate reassuring.  Patient was initially given IV fluids, Benadryl and Compazine without significant improvement.  IV Toradol was added, on last recheck patient reported feeling better and headache was improving.  States she was ready for discharge home.  She will follow-up outpatient with PCP.  Return precautions were also given.   CT Head Wo Contrast  Result Date: 11/15/2022 CLINICAL DATA:  Headache. EXAM: CT HEAD WITHOUT CONTRAST TECHNIQUE: Contiguous axial images were obtained from the base of the skull through the vertex without intravenous contrast. RADIATION DOSE REDUCTION: This exam was performed according to the departmental dose-optimization program which includes automated exposure control, adjustment of the mA and/or kV according to patient size and/or use of iterative reconstruction technique. COMPARISON:  December 15, 2019 FINDINGS: Brain: No evidence of acute infarction, hemorrhage, hydrocephalus, extra-axial collection or mass lesion/mass effect. Vascular: No hyperdense vessel or unexpected calcification. Skull: Normal. Negative for fracture or focal lesion. Sinuses/Orbits: No acute finding. Other: None. IMPRESSION: Normal head CT. Electronically Signed   By: Aram Candela M.D.   On: 11/15/2022 19:09           Pauline Aus, PA-C 11/15/22 2244    Benjiman Core, MD 11/15/22 [phone]

## 2024-01-07 ENCOUNTER — Other Ambulatory Visit: Payer: Self-pay

## 2024-01-07 ENCOUNTER — Emergency Department (HOSPITAL_BASED_OUTPATIENT_CLINIC_OR_DEPARTMENT_OTHER): Admission: EM | Admit: 2024-01-07 | Discharge: 2024-01-07 | Disposition: A | Discharge status: Home / Self Care

## 2024-01-07 ENCOUNTER — Emergency Department (HOSPITAL_BASED_OUTPATIENT_CLINIC_OR_DEPARTMENT_OTHER)

## 2024-01-07 ENCOUNTER — Encounter (HOSPITAL_BASED_OUTPATIENT_CLINIC_OR_DEPARTMENT_OTHER): Payer: Self-pay

## 2024-01-07 DIAGNOSIS — N200 Calculus of kidney: Secondary | ICD-10-CM

## 2024-01-07 DIAGNOSIS — R109 Unspecified abdominal pain: Secondary | ICD-10-CM | POA: Diagnosis present

## 2024-01-07 DIAGNOSIS — N132 Hydronephrosis with renal and ureteral calculous obstruction: Secondary | ICD-10-CM | POA: Insufficient documentation

## 2024-01-07 DIAGNOSIS — Z7982 Long term (current) use of aspirin: Secondary | ICD-10-CM | POA: Insufficient documentation

## 2024-01-07 LAB — URINALYSIS, ROUTINE W REFLEX MICROSCOPIC
Bacteria, UA: NONE SEEN
Bilirubin Urine: NEGATIVE
Glucose, UA: NEGATIVE mg/dL
Ketones, ur: NEGATIVE mg/dL
Nitrite: NEGATIVE
Protein, ur: 30 mg/dL — AB
RBC / HPF: >50 RBC/hpf (ref 0–5)
Specific Gravity, Urine: 1.02 (ref 1.005–1.030)
pH: 5.5 (ref 5.0–8.0)

## 2024-01-07 MED ORDER — KETOROLAC TROMETHAMINE 15 MG/ML IJ SOLN
15.0000 mg | Freq: Once | INTRAMUSCULAR | Status: AC
  Administered 2024-01-07: 15 mg via INTRAMUSCULAR
  Filled 2024-01-07: qty 1

## 2024-01-07 MED ORDER — ONDANSETRON HCL 4 MG PO TABS: 4.0000 mg | ORAL_TABLET | Freq: Three times a day (TID) | ORAL | Status: AC | PRN

## 2024-01-07 MED ORDER — TAMSULOSIN HCL 0.4 MG PO CAPS: 0.4000 mg | ORAL_CAPSULE | Freq: Every day | ORAL | 0 refills | Status: AC

## 2024-01-07 NOTE — Discharge Instructions (Signed)
 Alternate Tylenol  and Motrin every 3 hours as needed for pain.  Take your Zofran  as needed for nausea and vomiting.  Take your Flomax  daily.  Your kidney stone should pass on its own.  Return to the ER for new or worsening symptoms.

## 2024-01-07 NOTE — ED Notes (Signed)
 Strainer provided; patient verbalized understanding of use

## 2024-01-07 NOTE — ED Triage Notes (Signed)
 Left flank pain sudden onset today. Radiating into from groin. Difficulty urinating today. Denies CP SOB, N/V/D.

## 2024-01-08 NOTE — ED Provider Notes (Signed)
 [place] EMERGENCY DEPARTMENT AT [facility] Provider Note   CSN: [id number] Arrival date & time: 01/07/24  2025     Patient presents with: Flank Pain   Cheyenne Hamilton is a 58 y.o. female.   58 year old female presents for evaluation of left flank pain.  States it started earlier today and accompanied by some difficulty urinating.  She admits to some nausea but no vomiting or diarrhea.  States the pain comes in waves and is sharp in nature.  Denies any other symptoms or concerns at this time.   Flank Pain Pertinent negatives include no chest pain, no abdominal pain and no shortness of breath.       Prior to Admission medications   Medication Sig Start Date End Date Taking? Authorizing Provider  ondansetron  (ZOFRAN ) 4 MG tablet Take 1 tablet (4 mg total) by mouth every 8 (eight) hours as needed for up to 4 days for nausea or vomiting. 01/07/24 01/11/24 Yes Denilson Salminen L, DO  tamsulosin  (FLOMAX ) 0.4 MG CAPS capsule Take 1 capsule (0.4 mg total) by mouth daily for 14 days. 01/07/24 01/21/24 Yes Meagon Duskin L, DO  aspirin  EC 81 MG tablet Take 81 mg by mouth at bedtime. Swallow whole.    [provider]  clobetasol cream (TEMOVATE) 0.05 % Apply 1 application topically 2 (two) times daily as needed (irritation).    [provider]  clonazePAM  (KLONOPIN ) 0.5 MG tablet Take 1 mg by mouth at bedtime.    [provider]  colestipol  (COLESTID ) 1 g tablet Take 4 tablets (4 g total) by mouth daily. Do not take within 2 hours of other medications. Patient not taking: Reported on 09/04/2022 08/31/21   Ezzard Sonny RAMAN, PA-C  diclofenac Sodium (VOLTAREN) 1 % GEL Apply 2 g topically 4 (four) times daily as needed (pain).    [provider]  diphenoxylate-atropine (LOMOTIL) 2.5-0.025 MG tablet Take 1 tablet by mouth daily. Takes as needed. Patient not taking: Reported on 09/04/2022    [provider]  escitalopram  (LEXAPRO ) 10 MG  tablet Take 10 mg by mouth every morning.  12/04/19   [provider]  folic acid  (FOLVITE ) 1 MG tablet Take 1 mg by mouth every morning.     [provider]  furosemide (LASIX) 20 MG tablet Take 20 mg by mouth every morning.     [provider]  methotrexate (RHEUMATREX) 2.5 MG tablet Take 15 mg by mouth every Monday. 6 pills every monday     [provider]  metroNIDAZOLE (METROCREAM) 0.75 % cream Apply 1 Application topically daily. 08/21/22   [provider]  omeprazole (PRILOSEC) 20 MG capsule Take 20 mg by mouth every morning.  12/04/19   [provider]  OZEMPIC, 2 MG/DOSE, 8 MG/3ML SOPN Inject 2 mg into the skin once a week. On Sunday 08/26/22   [provider]  rosuvastatin  (CRESTOR ) 5 MG tablet Take 5 mg by mouth every morning.  12/04/19   [provider]  Semaglutide,0.25 or 0.5MG /DOS, (OZEMPIC, 0.25 OR 0.5 MG/DOSE,) 2 MG/1.5ML SOPN Inject 0.5 mg into the skin every Sunday. Patient not taking: Reported on 09/04/2022    [provider]  Vitamin D , Ergocalciferol , (DRISDOL ) 1.25 MG (50000 UNIT) CAPS capsule Take 50,000 Units by mouth once a week. Patient not taking: Reported on 09/04/2022 07/18/22   [provider]    Allergies: Hydrocodone    Review of Systems  Constitutional:  Negative for chills and fever.  HENT:  Negative for ear pain and sore throat.   Eyes:  Negative for pain and visual disturbance.  Respiratory:  Negative for cough and shortness of breath.   Cardiovascular:  Negative for chest pain and palpitations.  Gastrointestinal:  Negative for abdominal pain and vomiting.  Genitourinary:  Positive for dysuria and flank pain. Negative for hematuria.  Musculoskeletal:  Negative for arthralgias and back pain.  Skin:  Negative for color change and rash.  Neurological:  Negative for seizures and syncope.  All other systems reviewed and are negative.   Updated Vital Signs BP (!) 148/81    Pulse 72   Temp 98.2 F (36.8 C)   Resp 17   SpO2 97%   Physical Exam Vitals and nursing note reviewed.  Constitutional:      General: She is not in acute distress.    Appearance: Normal appearance. She is well-developed. She is not ill-appearing.  HENT:     Head: Normocephalic and atraumatic.  Eyes:     Conjunctiva/sclera: Conjunctivae normal.  Cardiovascular:     Rate and Rhythm: Normal rate and regular rhythm.     Heart sounds: No murmur heard. Pulmonary:     Effort: Pulmonary effort is normal. No respiratory distress.     Breath sounds: Normal breath sounds.  Abdominal:     Palpations: Abdomen is soft.     Tenderness: There is no abdominal tenderness. There is left CVA tenderness.  Musculoskeletal:        General: No swelling.     Cervical back: Neck supple.  Skin:    General: Skin is warm and dry.     Capillary Refill: Capillary refill takes less than 2 seconds.  Neurological:     Mental Status: She is alert.  Psychiatric:        Mood and Affect: Mood normal.     (all labs ordered are listed, but only abnormal results are displayed) Labs Reviewed  URINALYSIS, ROUTINE W REFLEX MICROSCOPIC - Abnormal; Notable for the following components:      Result Value   APPearance HAZY (*)    Hgb urine dipstick LARGE (*)    Protein, ur 30 (*)    Leukocytes,Ua TRACE (*)    All other components within normal limits    EKG: None  Radiology: CT Renal Stone Study Result Date: 01/07/2024 CLINICAL DATA:  Left-sided flank pain EXAM: CT ABDOMEN AND PELVIS WITHOUT CONTRAST TECHNIQUE: Multidetector CT imaging of the abdomen and pelvis was performed following the standard protocol without IV contrast. RADIATION DOSE REDUCTION: This exam was performed according to the departmental dose-optimization program which includes automated exposure control, adjustment of the mA and/or kV according to patient size and/or use of iterative reconstruction technique. COMPARISON:  None Available.  FINDINGS: Lower chest: Lung bases are free of acute infiltrate or sizable effusion. Hepatobiliary: No focal liver abnormality is seen. Status post cholecystectomy. No biliary dilatation. Pancreas: Unremarkable. No pancreatic ductal dilatation or surrounding inflammatory changes. Spleen: Normal in size without focal abnormality. Adrenals/Urinary Tract: Adrenal glands are within normal limits. Kidneys are well visualized bilaterally without renal calculi. Mild left hydronephrosis and perinephric stranding is seen. This extends inferiorly to the left UVJ where a small 1 mm stone causes obstructive change. The bladder is decompressed. Stomach/Bowel: No obstructive or inflammatory changes of the colon are seen. Mild diverticular change is seen. A loop of transverse colon extends into a umbilical hernia although no incarceration is noted. The appendix is within normal limits. Small bowel and stomach are unremarkable.  Vascular/Lymphatic: Aortic atherosclerosis. No enlarged abdominal or pelvic lymph nodes. Reproductive: Uterus and bilateral adnexa are unremarkable. Other: Fat containing umbilical hernia is noted. No abdominopelvic ascites. Musculoskeletal: No acute or significant osseous findings. IMPRESSION: 1 mm left UVJ stone with mild hydronephrosis and perinephric stranding. Fat containing umbilical hernia containing a knuckle of transverse colon without incarceration. Electronically Signed   By: Oneil Devonshire M.D.   On: 01/07/2024 21:54     Procedures   Medications Ordered in the ED  ketorolac  (TORADOL ) 15 MG/ML injection 15 mg (15 mg Intramuscular Given 01/07/24 2241)                                    Medical Decision Making Patient with no evidence of UTI but does have a small 1 mm kidney stone in the left ureter.  She is fairly comfortable.  Will give a dose of Toradol  and advised Tylenol  Motrin as needed for pain.  Will start her on Flomax  and give her prescription for Zofran  to use as needed.  Advise  follow-up with urology and primary care and otherwise return to the ER for new or worsening symptoms.  Feels comfortable with plan to discharge home.  Problems Addressed: Kidney stone: acute illness or injury  Amount and/or Complexity of Data Reviewed External Data Reviewed: notes.    Details: Outpatient records reviewed patient without any recent office visits in the last 6 months Labs: ordered. Decision-making details documented in ED Course.    Details: Ordered and reviewed and unremarkable, no evidence of UTI Radiology: ordered and independent interpretation performed. Decision-making details documented in ED Course.    Details: Ordered and reviewed by me and CT abdomen pelvis shows that the patient has evidence of small left 1 mm kidney stone  Risk OTC drugs. Prescription drug management.     Final diagnoses:  Kidney stone    ED Discharge Orders          Ordered    tamsulosin  (FLOMAX ) 0.4 MG CAPS capsule  Daily        01/07/24 2238    ondansetron  (ZOFRAN ) 4 MG tablet  Every 8 hours PRN        01/07/24 2238               Taheem Fricke L, DO 01/08/24 1732

## 2024-03-10 ENCOUNTER — Ambulatory Visit (INDEPENDENT_AMBULATORY_CARE_PROVIDER_SITE_OTHER): Admitting: Primary Care

## 2024-03-10 ENCOUNTER — Encounter: Payer: Self-pay | Admitting: Primary Care

## 2024-03-10 ENCOUNTER — Telehealth: Payer: Self-pay

## 2024-03-10 VITALS — BP 126/70 | HR 86 | Temp 97.6°F | Ht 64.0 in | Wt 227.0 lb

## 2024-03-10 DIAGNOSIS — K029 Dental caries, unspecified: Secondary | ICD-10-CM | POA: Diagnosis not present

## 2024-03-10 DIAGNOSIS — F17210 Nicotine dependence, cigarettes, uncomplicated: Secondary | ICD-10-CM

## 2024-03-10 DIAGNOSIS — G4730 Sleep apnea, unspecified: Secondary | ICD-10-CM

## 2024-03-10 DIAGNOSIS — R682 Dry mouth, unspecified: Secondary | ICD-10-CM | POA: Diagnosis not present

## 2024-03-10 NOTE — Telephone Encounter (Signed)
 Pt came in office today for a OSA consult. I am trying to get a download regarding pt's CPAP but a DME is not listed in her chart, and she is not pulling up on [facility]. ATC pt X1. LMTCB

## 2024-03-10 NOTE — Patient Instructions (Signed)
  VISIT SUMMARY: Today, you visited to discuss concerns about your CPAP use and dry mouth. You have been managing sleep apnea for eight years and recently experienced severe dry mouth, leading to dental issues. Despite significant weight loss, you haven't noticed a change in sleep quality with your CPAP machine. We discussed your current CPAP settings and potential adjustments to improve your comfort and health.  YOUR PLAN: -OBSTRUCTIVE SLEEP APNEA WITH CPAP INTOLERANCE AND RECENT SIGNIFICANT WEIGHT LOSS: Obstructive sleep apnea is a condition where your airway becomes blocked during sleep, causing breathing interruptions. Given your significant weight loss, we will reassess the severity of your sleep apnea with a home sleep study. We will also try to download data from your CPAP machine to evaluate the current settings and consider lowering the pressure if needed. If CPAP is still required, we may explore using a nasal mask with a chin strap or nasal pillows as alternatives to your full face mask.  -XEROSTOMIA AND DENTAL CARIES SECONDARY TO CPAP THERAPY: Xerostomia, or dry mouth, can lead to dental cavities and discomfort. This may be caused by your CPAP therapy. We discussed adding heated humidity to your CPAP machine if it's not already in use and considering a nasal mask to reduce dry mouth symptoms. Using a chin strap to keep your mouth closed during sleep may also help if you switch to a nasal mask. We also discussed lowering pressure settings possibly.   INSTRUCTIONS: Please complete the home sleep study to reassess the severity of your sleep apnea. We will also attempt to download data from your CPAP machine to evaluate the current settings. Follow up with us  after completing the sleep study to discuss the results and any necessary adjustments to your treatment plan.  Follow-up Please call or send me a mychart message 2-3 weeks after completing sleep study for results/treatment options

## 2024-03-10 NOTE — Progress Notes (Signed)
 @Patient  ID: Cheyenne Hamilton, female    DOB: [birth year], 58 y.o.   MRN: [id number]  Chief Complaint  Patient presents with   Consult    Diagnosed with OSA 8 years ago. Wears a CPAP HS. Did a HST ordered through [facility], [place]     Referring provider: Buttner [place], [place]  HPI: 58 year old female, current every day smoker. PMH significant for sleep apnea, GERD, pre-diabetes, obesity.   03/10/2024 Discussed the use of AI scribe software for clinical note transcription with the patient, who gave verbal consent to proceed.  History of Present Illness Cheyenne Hamilton is a 58 year old female with sleep apnea who presents with concerns about CPAP use and dry mouth. She was referred by a friend who highly recommended the provider.  She has been managing sleep apnea for approximately eight years. Initially, she used a sleep apnea guard made by her dentist, but it was ineffective, leading her to switch to a full face CPAP mask. Over the last year to year and a half, she has developed dry mouth, resulting in cavities on her upper anterior teeth.  She has been adjusting the humidity settings on her CPAP machine, increasing it from a level of three to seven, which causes moisture to accumulate in the hose and wake her up at night. She is unsure if her CPAP machine has heated humidity.  She has lost 78 pounds over the last two years, attributed to the use of GLP medication. She has not noticed a significant difference in her sleep quality with or without the CPAP machine. No episodes of gasping or choking at night, although she used to snore, as noted by a previous boyfriend.  Her last sleep study, conducted in January 2020, indicated severe sleep apnea with about sixty apneic events. At that time, she weighed 273lbs. The highest her weight has been is 303 pounds.   She is a Armed forces operational officer and is concerned about the impact of dry mouth on her dental health. She also  smokes.  She has been using CPAP supplies from a friend whose husband passed away, and she has not changed the tubing as recommended every three months, although she cleans it regularly.  Allergies  Allergen Reactions   Hydrocodone Other (See Comments)    Pt states made her have a rapid heart beat     There is no immunization history on file for this patient.  Past Medical History:  Diagnosis Date   Acid reflux    Anxiety    Back pain    Complication of anesthesia    Hard to wake up   Depression    GERD (gastroesophageal reflux disease)    Glaucoma    High triglycerides    Hyperlipidemia    IBS (irritable bowel syndrome)    Knee pain    Lower extremity edema    Ocular migraine    Palpitations    PCOS (polycystic ovarian syndrome)    Pre-diabetes    Prediabetes    Psoriasis    Sleep apnea     Tobacco History: Social History   Tobacco Use  Smoking Status Every Day   Current packs/day: 0.75   Average packs/day: 0.8 packs/day for 30.0 years (22.5 ttl pk-yrs)   Types: Cigarettes  Smokeless Tobacco Never  Tobacco Comments   smoked last at 6:00am   Ready to quit: Not Answered Counseling given: Not Answered Tobacco comments: smoked last at 6:00am   Outpatient Medications  Prior to Visit  Medication Sig Dispense Refill   aspirin  EC 81 MG tablet Take 81 mg by mouth at bedtime. Swallow whole.     clobetasol cream (TEMOVATE) 0.05 % Apply 1 application topically 2 (two) times daily as needed (irritation).     clonazePAM  (KLONOPIN ) 0.5 MG tablet Take 1 mg by mouth at bedtime.     diclofenac Sodium (VOLTAREN) 1 % GEL Apply 2 g topically 4 (four) times daily as needed (pain).     escitalopram  (LEXAPRO ) 10 MG tablet Take 10 mg by mouth every morning.      folic acid  (FOLVITE ) 1 MG tablet Take 1 mg by mouth every morning.      furosemide (LASIX) 20 MG tablet Take 20 mg by mouth every morning.      methotrexate (RHEUMATREX) 2.5 MG tablet Take 15 mg by mouth every Monday. 6  pills every monday      metroNIDAZOLE (METROCREAM) 0.75 % cream Apply 1 Application topically daily.     MOUNJARO 12.5 MG/0.5ML Pen      omeprazole (PRILOSEC) 20 MG capsule Take 20 mg by mouth every morning.      rosuvastatin  (CRESTOR ) 5 MG tablet Take 5 mg by mouth every morning.      Vitamin D , Ergocalciferol , (DRISDOL ) 1.25 MG (50000 UNIT) CAPS capsule Take 50,000 Units by mouth once a week.     colestipol  (COLESTID ) 1 g tablet Take 4 tablets (4 g total) by mouth daily. Do not take within 2 hours of other medications. (Patient not taking: Reported on 03/10/2024) 120 tablet 3   diphenoxylate-atropine (LOMOTIL) 2.5-0.025 MG tablet Take 1 tablet by mouth daily. Takes as needed. (Patient not taking: Reported on 03/10/2024)     OZEMPIC, 2 MG/DOSE, 8 MG/3ML SOPN Inject 2 mg into the skin once a week. On Sunday (Patient not taking: Reported on 03/10/2024)     Semaglutide,0.25 or 0.5MG /DOS, (OZEMPIC, 0.25 OR 0.5 MG/DOSE,) 2 MG/1.5ML SOPN Inject 0.5 mg into the skin every Sunday. (Patient not taking: Reported on 03/10/2024)     No facility-administered medications prior to visit.   Review of Systems  Review of Systems  Constitutional: Negative.   Respiratory: Negative.     Physical Exam  BP 126/70   Pulse 86   Temp 97.6 F (36.4 C)   Ht 5' 4 (1.626 m)   Wt 227 lb (103 kg)   SpO2 98% Comment: RA  BMI 38.96 kg/m  Physical Exam Constitutional:      Appearance: Normal appearance. She is well-developed.  HENT:     Head: Normocephalic and atraumatic.     Mouth/Throat:     Mouth: Mucous membranes are moist.     Pharynx: Oropharynx is clear.  Eyes:     Pupils: Pupils are equal, round, and reactive to light.  Cardiovascular:     Rate and Rhythm: Normal rate and regular rhythm.     Heart sounds: Normal heart sounds. No murmur heard. Pulmonary:     Effort: Pulmonary effort is normal. No respiratory distress.     Breath sounds: Normal breath sounds. No wheezing or rhonchi.  Abdominal:      General: Bowel sounds are normal.     Palpations: Abdomen is soft.     Tenderness: There is no abdominal tenderness.  Musculoskeletal:        General: Normal range of motion.     Cervical back: Normal range of motion and neck supple.  Skin:    General: Skin is warm and dry.  Findings: No erythema or rash.  Neurological:     General: No focal deficit present.     Mental Status: She is alert and oriented to person, place, and time. Mental status is at baseline.  Psychiatric:        Mood and Affect: Mood normal.        Behavior: Behavior normal.        Thought Content: Thought content normal.        Judgment: Judgment normal.      Lab Results:  CBC    Component Value Date/Time   WBC 10.2 03/22/2021 1513   RBC 4.58 03/22/2021 1513   HGB 14.4 03/22/2021 1513   HGB 15.5 12/23/2019 1535   HCT 43.3 03/22/2021 1513   HCT 47.0 (H) 12/23/2019 1535   PLT 317 03/22/2021 1513   PLT 307 12/23/2019 1535   MCV 94.5 03/22/2021 1513   MCV 97 12/23/2019 1535   MCH 31.4 03/22/2021 1513   MCHC 33.3 03/22/2021 1513   RDW 13.6 03/22/2021 1513   RDW 13.4 12/23/2019 1535   LYMPHSABS 3,040 03/22/2021 1513   LYMPHSABS 3.0 12/23/2019 1535   MONOABS 0.8 12/15/2019 2036   EOSABS 694 (H) 03/22/2021 1513   EOSABS 0.4 12/23/2019 1535   BASOSABS 41 03/22/2021 1513   BASOSABS 0.1 12/23/2019 1535    BMET    Component Value Date/Time   NA 140 03/22/2021 1513   NA 140 04/19/2020 1119   K 4.0 03/22/2021 1513   CL 101 03/22/2021 1513   CO2 32 03/22/2021 1513   GLUCOSE 81 03/22/2021 1513   BUN 9 03/22/2021 1513   BUN 12 04/19/2020 1119   CREATININE 0.73 03/22/2021 1513   CALCIUM  9.3 03/22/2021 1513   GFRNONAA 91 04/19/2020 1119   GFRAA 105 04/19/2020 1119    BNP No results found for: BNP  ProBNP No results found for: PROBNP  Imaging: No results found.   Assessment & Plan:   1. Sleep apnea, unspecified type (Primary) - Home sleep test; Future  Assessment and  Plan Assessment & Plan Obstructive sleep apnea  HST in 2020 showed severe obstructive sleep apnea with approximately sixty apneic events per hour. Significant weight loss of seventy-eight pounds since the last sleep study in January 2020. Current CPAP pressure set at seventeen may be excessive given the weight loss. Reports no significant improvement in sleep quality with CPAP use and experiences CPAP intolerance due to xerostomia and discomfort. No episodes of gasping or choking, but snoring was noted by a previous partner. Considering the weight loss, the severity of sleep apnea may have decreased, potentially eliminating the need for CPAP. Discussed the potential for mild sleep apnea to have minimal risk, and the possibility of not requiring CPAP if the condition has improved. - Order home sleep study to reassess the severity of sleep apnea. - Attempt to obtain a download from the CPAP machine to evaluate current settings. - Consider lowering CPAP pressure if sleep apnea persists. - Discuss potential use of a nasal mask with a chin strap if CPAP is still required. - Evaluate the possibility of using nasal pillows as an alternative to the full face mask.  Xerostomia and dental caries Xerostomia and dental caries, particularly affecting the upper anterior teeth, possibly due to CPAP therapy. Reports severe xerostomia and the development of cavities since using a full face CPAP mask. Humidification settings have been adjusted, but increased humidity causes water  accumulation in the tubing, leading to sleep disturbances. Discussed the potential for  different mask options to alleviate xerostomia symptoms. - Consider adding heated humidity to CPAP if not already in use. - Evaluate the use of a nasal mask to potentially reduce xerostomia symptoms. - Discuss the use of a chin strap to keep the mouth closed during sleep if a nasal mask is used.   Almarie LELON Ferrari, NP 03/10/2024

## 2024-03-11 ENCOUNTER — Telehealth: Payer: Self-pay

## 2024-03-11 NOTE — Telephone Encounter (Signed)
 Copied from CRM #[id number]. Topic: Clinical - Order For Equipment >> Mar 10, 2024  2:06 PM Cheyenne Hamilton wrote: Reason for CRM: Patient returning phone call from [place] - states she received her CPAP from [facility] on [address].   Callback number: [phone] ok to leave voicemail.  NFN. Pt is eligible for [facility] new machine.

## 2024-03-17 ENCOUNTER — Encounter

## 2024-03-17 DIAGNOSIS — G4730 Sleep apnea, unspecified: Secondary | ICD-10-CM

## 2024-03-31 DIAGNOSIS — G4733 Obstructive sleep apnea (adult) (pediatric): Secondary | ICD-10-CM | POA: Diagnosis not present

## 2024-04-05 ENCOUNTER — Ambulatory Visit: Payer: Self-pay | Admitting: Primary Care

## 2024-04-05 NOTE — Progress Notes (Signed)
 Please let patient know HST showed moderate OSA, patient had average 15 apneas per hour. Set up virtual visit to discuss treatment options

## 2024-04-06 NOTE — Progress Notes (Signed)
 Called the pt and there was no answer- LMTCB

## 2024-04-07 ENCOUNTER — Telehealth: Payer: Self-pay | Admitting: *Deleted

## 2024-04-07 NOTE — Telephone Encounter (Signed)
 LMTCB needs virtual visit to go over HST results per [facility].  Copied from CRM [id number]. Topic: Clinical - Lab/Test Results >> Apr 06, 2024  1:21 PM Corean SAUNDERS wrote: Reason for CRM: Patient missed a call from [facility] regarding sleep study test results. E2C2 agent attempted to reach CAL with success.  Please return patiens phone call tomorrow as she is off of work.

## 2024-04-08 NOTE — Progress Notes (Signed)
 ATCx2, LVMTCB. Sending MyChart message per protocol as pt has been unable to be reached. NFN

## 2024-04-09 NOTE — Telephone Encounter (Addendum)
 Pt called back due to 2 additional attempts to contact her to set up VV to go over sleep study results. Again, pt would like you to know she has a VV scheduled on 11/14 w/ Elizabeth.  If this needs to be changed, please let her know.

## 2024-04-13 NOTE — Telephone Encounter (Signed)
 VV is scheduled with Beth on 11/14 at 2:30. Left confirmation VM. NFN

## 2024-04-22 ENCOUNTER — Telehealth: Payer: Self-pay

## 2024-04-22 NOTE — Telephone Encounter (Signed)
 Pt is scheduled to see BW tomorrow for a virtual f/u appt. I was unable to find pt in [facility]. I atc pt x1 and did lvm.   I wanted to know if pt was currently treating her OSA and if so, with what? When was the last time she used her machine if she has one? Who would the DME be?

## 2024-04-23 ENCOUNTER — Telehealth (INDEPENDENT_AMBULATORY_CARE_PROVIDER_SITE_OTHER): Admitting: Primary Care

## 2024-04-23 ENCOUNTER — Telehealth: Payer: Self-pay | Admitting: Primary Care

## 2024-04-23 DIAGNOSIS — G4730 Sleep apnea, unspecified: Secondary | ICD-10-CM | POA: Diagnosis not present

## 2024-04-23 NOTE — Telephone Encounter (Signed)
 Needs compliance check in for new CPAP machine in 6-8 weeks (January 2026)

## 2024-04-23 NOTE — Progress Notes (Signed)
 Virtual Visit via Video Note  I connected with Cheyenne Hamilton on 04/23/24 at  2:30 PM EST by a video enabled telemedicine application and verified that I am speaking with the correct person using two identifiers.  Location: Patient: Home Provider: Office    I discussed the limitations of evaluation and management by telemedicine and the availability of in person appointments. The patient expressed understanding and agreed to proceed.  History of Present Illness:  57 year old female, current every day smoker. PMH significant for sleep apnea, GERD, pre-diabetes, obesity.   Previous [facility] pulmonary encounter: 03/10/2024 Discussed the use of AI scribe software for clinical note transcription with the patient, who gave verbal consent to proceed.  History of Present Illness Cheyenne Hamilton is a 58 year old female with sleep apnea who presents with concerns about CPAP use and dry mouth. She was referred by a friend who highly recommended the provider.  She has been managing sleep apnea for approximately eight years. Initially, she used a sleep apnea guard made by her dentist, but it was ineffective, leading her to switch to a full face CPAP mask. Over the last year to year and a half, she has developed dry mouth, resulting in cavities on her upper anterior teeth.  She has been adjusting the humidity settings on her CPAP machine, increasing it from a level of three to seven, which causes moisture to accumulate in the hose and wake her up at night. She is unsure if her CPAP machine has heated humidity.  She has lost 78 pounds over the last two years, attributed to the use of GLP medication. She has not noticed a significant difference in her sleep quality with or without the CPAP machine. No episodes of gasping or choking at night, although she used to snore, as noted by a previous boyfriend.  Her last sleep study, conducted in January 2020, indicated severe sleep apnea with about  sixty apneic events. At that time, she weighed 273lbs. The highest her weight has been is 303 pounds.   She is a armed forces operational officer and is concerned about the impact of dry mouth on her dental health. She also smokes.  She has been using CPAP supplies from a friend whose husband passed away, and she has not changed the tubing as recommended every three months, although she cleans it regularly.  04/23/2024- Interim hx  Discussed the use of AI scribe software for clinical note transcription with the patient, who gave verbal consent to proceed. History of Present Illness Cheyenne Hamilton is a 58 year old female with severe sleep apnea who presents for follow-up after significant weight loss and a recent sleep study.  She has a history of severe sleep apnea diagnosed in 2020 with a home sleep study showing approximately 60 apneic events per hour. Since her last sleep study in January 2020, she has experienced significant weight loss of 78 pounds. Her CPAP machine is currently set at a pressure of 17.  A recent repeat sleep study was conducted due to her weight loss, which showed improvement in her condition to moderate sleep apnea with 15.4 apneic events per hour. She still experiences oxygen desaturation, with levels dropping to 84% and spending about an hour with oxygen levels below 88%.  She has not used her CPAP machine for the past two months, stating that even when using the CPAP, she did not feel more rested. She continues to experience tiredness, which she attributes to low  vitamin D  and B12 levels. She is currently taking over-the-counter vitamin D3 and B12 supplements daily.  She has an oral appliance but has not been using it. She is concerned about mouth breathing, waking up with a dry mouth, and experiencing xerostomia, which she believes contributes to dental cavities. She also reports waking up with a sore throat that resolves after 5-10 minutes.  She is currently on  Mounjaro for weight management and has a goal weight of 180 pounds. She has lost 84 pounds since her last visit. She is concerned about the impact of her CPAP mask on her facial skin, particularly the enhancement of wrinkle lines due to the mask's fit.    Observations/Objective:  Appears well without overt respiratory symptoms   Assessment and Plan:  1. Sleep apnea, unspecified type (Primary) - Ambulatory Referral for DME  Assessment and Plan Assessment & Plan Obstructive sleep apnea with nocturnal hypoxemia Moderate obstructive sleep apnea with nocturnal hypoxemia, improved from severe sleep apnea with 64.8 events per hour to 15.4 events per hour following significant weight loss. Oxygen levels dropped to 84% with over an hour spent below 88%, indicating ongoing risk for cardiac stress and other complications. CPAP therapy is recommended to prevent these risks. She has not worn CPAP for two months and is interested in trying a nasal mask due to discomfort with the full face mask. Discussed potential benefits of nasal mask and chin strap to address mouth breathing and dryness. Explained that CPAP provides continuous pressure to prevent airway obstruction. Discussed potential for weight loss to further improve sleep apnea and possibly discontinue CPAP in the future. - Ordered new CPAP machine with settings adjusted to 6-12 cm H2O. - Referred to [facility] for nasal mask fitting and new machine acquisition. - Recommended trial of nasal mask with chin strap to address mouth breathing. - Scheduled follow-up compliance check in January to assess new settings and mask fit.  Weight loss secondary to GLP-1 agonist therapy Significant weight loss of 78 pounds since last sleep study, contributing to improvement in sleep apnea severity. Current weight is 150 pounds with a goal of 180 pounds. Continued weight loss may further improve sleep apnea and potentially allow for discontinuation of CPAP therapy. -  Continue GLP-1 agonist therapy (Mounjaro) to achieve weight loss goal.  Xerostomia (mouth dryness) associated with CPAP therapy Mouth dryness and sore throat upon waking, likely due to mouth breathing during CPAP use. Discussed use of chin strap to train mouth closure and reduce dryness. Consideration of nasal mask to minimize air leaks and improve comfort. - Recommended use of chin strap with nasal mask to reduce mouth breathing and dryness. - Recommend biotene mouth wash for dry mouth  - Advised on potential use of fluoride trays to address dental concerns related to dry mouth.  Recording duration: 24 minutes   Follow Up Instructions:   FU in 6-8 weeks  I discussed the assessment and treatment plan with the patient. The patient was provided an opportunity to ask questions and all were answered. The patient agreed with the plan and demonstrated an understanding of the instructions.   The patient was advised to call back or seek an in-person evaluation if the symptoms worsen or if the condition fails to improve as anticipated.  I provided 30 minutes of non-face-to-face time during this encounter.   Almarie LELON Ferrari, NP

## 2024-04-23 NOTE — Addendum Note (Signed)
 Addended by: HOPE ALMARIE ORN on: 04/23/2024 03:26 PM   Modules accepted: Orders

## 2024-04-26 NOTE — Telephone Encounter (Signed)
 [facility] left VM sent LTR

## 2024-04-26 NOTE — Telephone Encounter (Signed)
 Copied from CRM #[id number]. Topic: Clinical - Order For Equipment >> Apr 26, 2024  1:27 PM Chantha C wrote: Reason for CRM: Patient is returning the office on scheduling a cpap compliance, but patient has not received a call from [facility] on a new cpap machine. Informed patient to contact [facility] [phone] and if there's any issue to contact the office again. Informed patient, a compliance appointment is needed within 31-90 days from date of pick for device. Patient verified understanding and denies any other concerns. >> Apr 26, 2024  1:29 PM Leila C wrote: RICK   Order shows its still pending.  [facility] can we check on this order .

## 2024-04-27 NOTE — Telephone Encounter (Signed)
 I have sent a message over to [facility] to CPAP machine. It has been sent as urgent. Awaiting response.

## 2024-04-29 ENCOUNTER — Encounter: Payer: Self-pay | Admitting: Emergency Medicine

## 2024-04-29 NOTE — Telephone Encounter (Signed)
 Order was received by [facility] on 04/27/24 By Arvella. I will call for an update on the issue.

## 2024-04-29 NOTE — Telephone Encounter (Signed)
 Cheyenne Hamilton has sent out a message to Cheyenne Hamilton inregards to this order. I have also sent an urgent response to have the patient called for an update. Cheyenne Hamilton will have them give me a call if anything is needed. NFN

## 2024-04-29 NOTE — Telephone Encounter (Signed)
 Cheyenne Hamilton  [place], [place]; [place], [place]; [place]; [place], [place]; Sheree Glatter [place] now and sending for processing. Thanks!

## 2024-04-29 NOTE — Telephone Encounter (Signed)
 Copied from CRM [id number]. Topic: Clinical - Order For Equipment >> Apr 28, 2024 12:05 PM Cheyenne Hamilton wrote: Reason for CRM: Pt is returning a phone call from [place] regarding the pt's CPAP order. Marcus asked [place] for an update regarding the pt's order.   [place] stated I have sent a message over to [facility] to CPAP machine. It has been sent as urgent. Awaiting response. At 843am on 04/27/2024.  Pt has called [facility] today and was told by [facility] they do not have an order for the pt.   Please follow up with the pt regarding the CPAP order at phone number [phone] ok to leave a vm. (Pt stated to please leave a detailed message if she can't answer, it's okay to leave specifics so she doesn't have to keep calling back and forth.)  Can you all find out what is going on with the order for the CPAP machine?  Thank you.

## 2024-05-10 ENCOUNTER — Telehealth: Payer: Self-pay

## 2024-05-10 NOTE — Telephone Encounter (Signed)
 CMN received from [facility] regarding pt's CPAP supplies. This is only requiring a provider signature. I will fax once this is signed.

## 2024-05-17 NOTE — Telephone Encounter (Signed)
 This has been signed and faxed. NFN
